# Patient Record
Sex: Female | Born: 1935 | Race: White | Hispanic: No | Smoking: Never smoker
Health system: Southern US, Community
[De-identification: ages and names within clinical notes are randomized; demographics above are authoritative.]

## PROBLEM LIST (undated history)

## (undated) DIAGNOSIS — I10 Essential (primary) hypertension: Secondary | ICD-10-CM

## (undated) DIAGNOSIS — F32A Depression, unspecified: Secondary | ICD-10-CM

## (undated) DIAGNOSIS — H3530 Unspecified macular degeneration: Secondary | ICD-10-CM

---

## 1993-04-23 HISTORY — PX: BREAST EXCISIONAL BIOPSY: SUR124

## 2004-02-29 ENCOUNTER — Ambulatory Visit: Payer: Self-pay

## 2004-04-23 HISTORY — PX: BREAST CYST ASPIRATION: SHX578

## 2004-09-18 ENCOUNTER — Ambulatory Visit: Payer: Self-pay | Admitting: Internal Medicine

## 2004-09-25 ENCOUNTER — Ambulatory Visit: Payer: Self-pay | Admitting: General Surgery

## 2004-10-10 ENCOUNTER — Ambulatory Visit: Payer: Self-pay | Admitting: General Surgery

## 2004-11-17 ENCOUNTER — Ambulatory Visit: Payer: Self-pay | Admitting: General Surgery

## 2005-10-31 ENCOUNTER — Ambulatory Visit: Payer: Self-pay | Admitting: Internal Medicine

## 2006-11-04 ENCOUNTER — Ambulatory Visit: Payer: Self-pay | Admitting: Internal Medicine

## 2006-12-10 ENCOUNTER — Ambulatory Visit: Payer: Self-pay | Admitting: Gastroenterology

## 2007-11-05 ENCOUNTER — Ambulatory Visit: Payer: Self-pay | Admitting: Internal Medicine

## 2008-11-08 ENCOUNTER — Ambulatory Visit: Payer: Self-pay | Admitting: Internal Medicine

## 2009-11-09 ENCOUNTER — Ambulatory Visit: Payer: Self-pay | Admitting: Internal Medicine

## 2011-03-06 ENCOUNTER — Ambulatory Visit: Payer: Self-pay | Admitting: Internal Medicine

## 2011-03-13 ENCOUNTER — Ambulatory Visit: Payer: Self-pay | Admitting: Internal Medicine

## 2011-12-04 ENCOUNTER — Ambulatory Visit: Payer: Self-pay | Admitting: Gastroenterology

## 2011-12-05 LAB — PATHOLOGY REPORT

## 2012-03-12 ENCOUNTER — Ambulatory Visit: Payer: Self-pay | Admitting: Internal Medicine

## 2012-09-17 ENCOUNTER — Ambulatory Visit: Payer: Self-pay | Admitting: Ophthalmology

## 2012-11-19 ENCOUNTER — Ambulatory Visit: Payer: Self-pay | Admitting: Ophthalmology

## 2013-03-24 ENCOUNTER — Ambulatory Visit: Payer: Self-pay | Admitting: Family Medicine

## 2013-07-30 IMAGING — MG MM CAD SCREENING MAMMO
1 series · 5 of 5 positions shown · non-contrast
Comparison: none

REASON FOR EXAM: routine
COMMENTS:

PROCEDURE:     MAM - MAM DGTL SCREENING MAMMO W/CAD  - March 12, 2012 [DATE]
RESULT:
Comparisons: 03/06/2011, 11/09/2009, 11/05/2007, and 11/04/2006.  Left breast
ultrasound 03/13/2011.

[R CC · right · 5 of 5 slices shown]
[im 1/5]
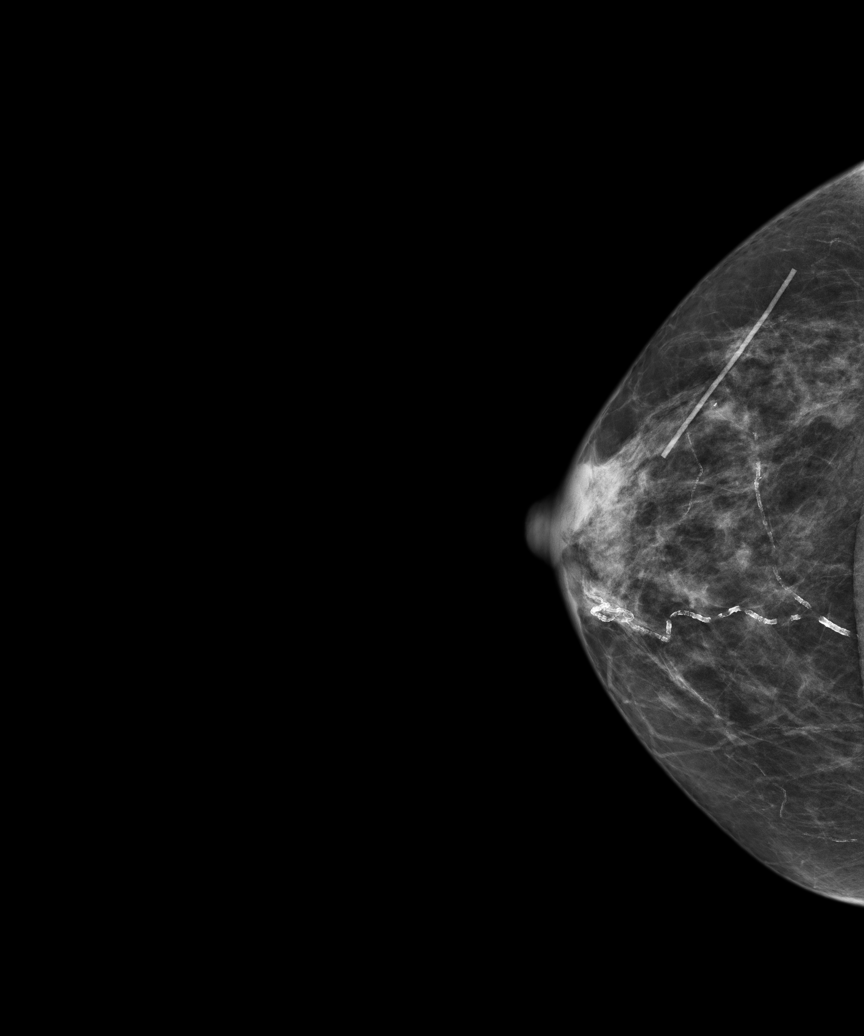
[im 2/5]
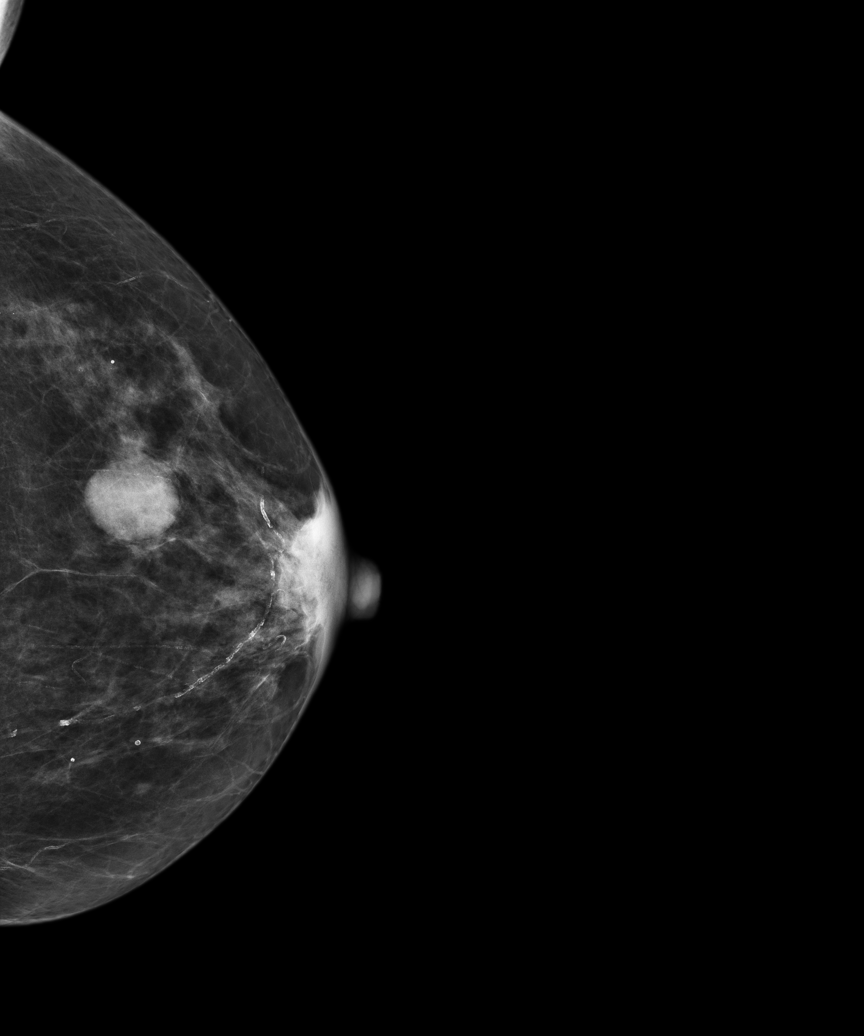
[im 3/5]
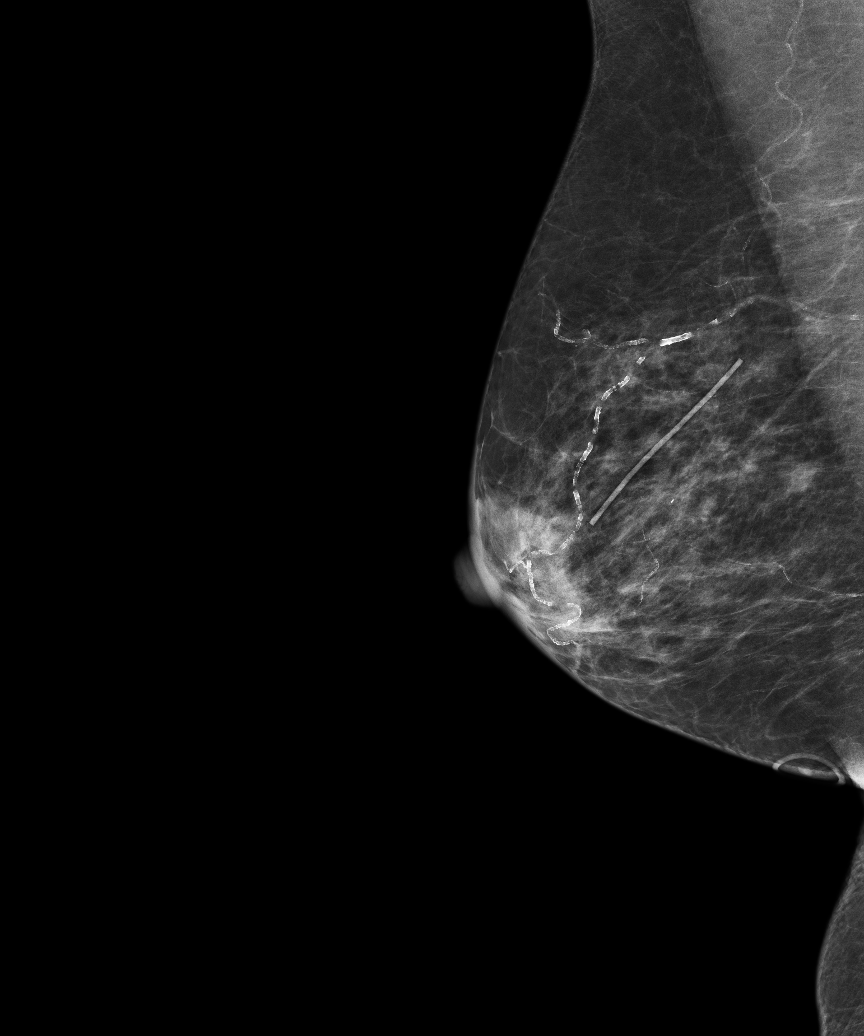
[im 4/5]
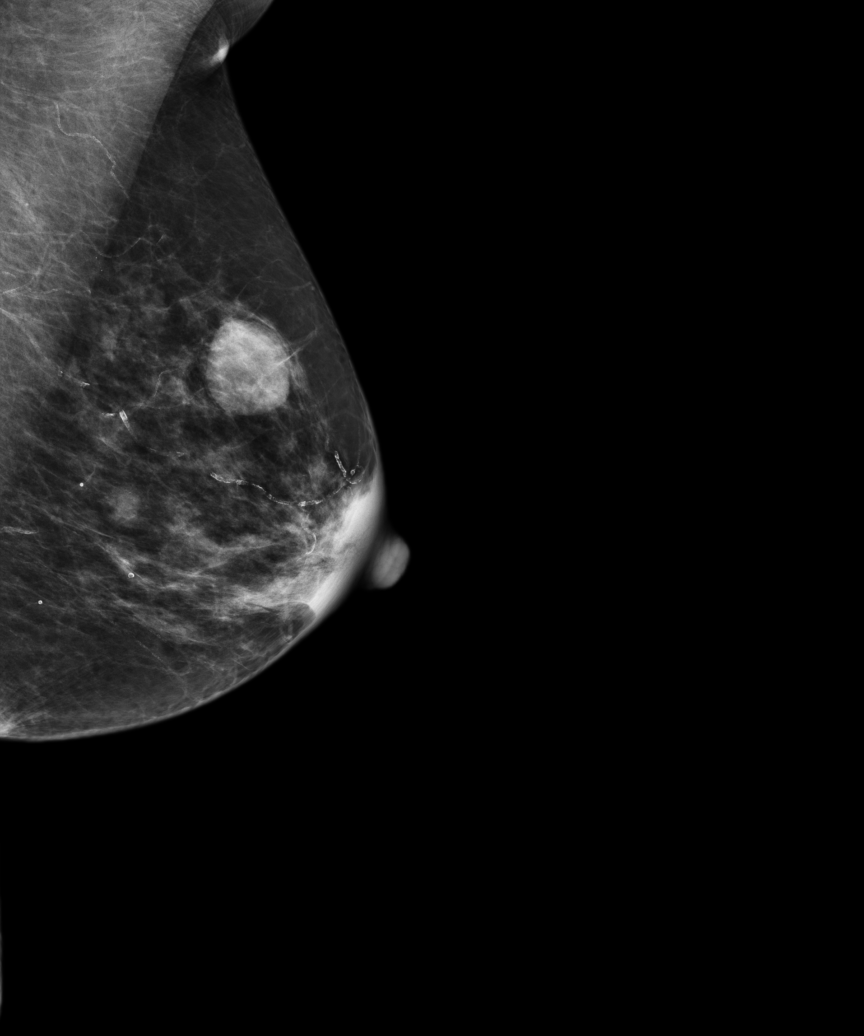
[im 5/5]
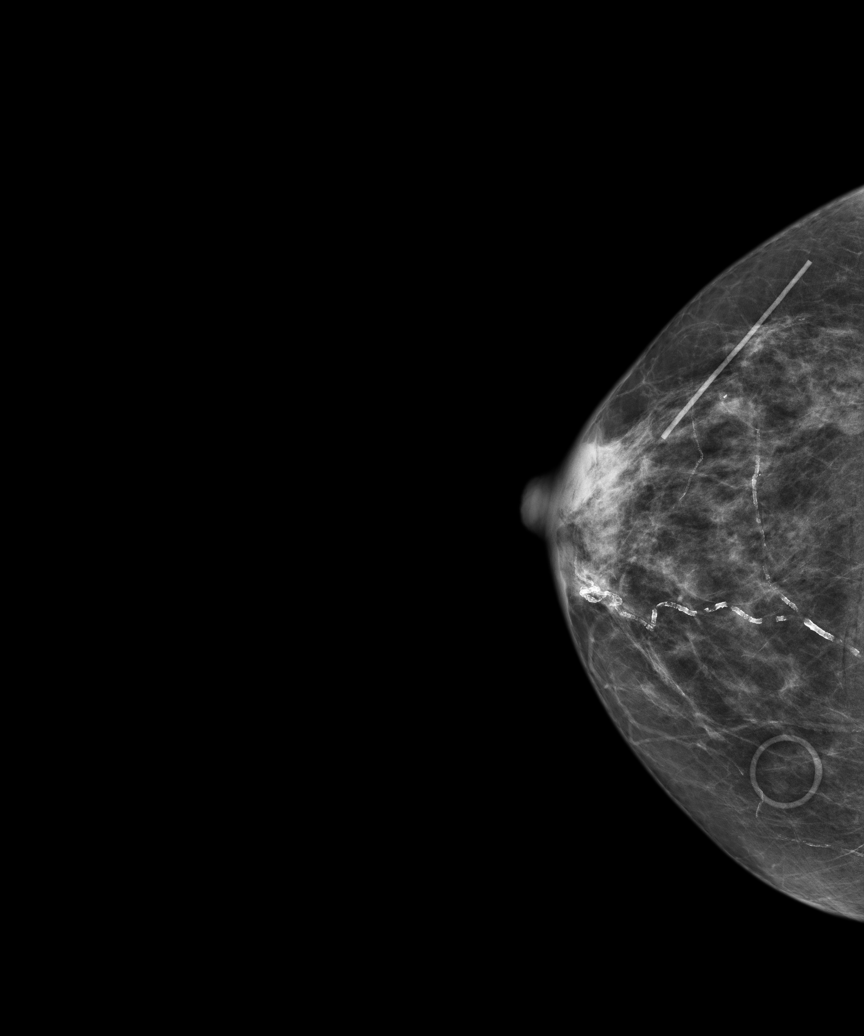

[5 of 5 positions shown; findings below may reference images not displayed]

FINDINGS: There is scattered fibroglandular tissue. The round mass in the superior
left breast is similar to minimally increased in size from prior. This was
previously shown to represent a cyst. There are other small round masses in
the bilateral breasts similar to prior studies, which likely represent
fluctuating cysts.  No suspicious masses or calcifications are identified.
IMPRESSION: BI-RADS: Category 2 - Benign Finding.

Recommend continued annual screening mammography.

A NEGATIVE MAMMOGRAM REPORT DOES NOT PRECLUDE BIOPSY OR OTHER EVALUATION OF
A CLINICALLY PALPABLE OR OTHERWISE SUSPICIOUS MASS OR LESION. BREAST CANCER
MAY NOT BE DETECTED BY MAMMOGRAPHY IN UP TO 10% OF CASES.

## 2014-05-25 ENCOUNTER — Ambulatory Visit: Payer: Self-pay | Admitting: Family Medicine

## 2015-07-18 ENCOUNTER — Other Ambulatory Visit: Payer: Self-pay | Admitting: Family Medicine

## 2015-07-18 DIAGNOSIS — Z1231 Encounter for screening mammogram for malignant neoplasm of breast: Secondary | ICD-10-CM

## 2015-08-01 ENCOUNTER — Ambulatory Visit
Admission: RE | Admit: 2015-08-01 | Discharge: 2015-08-01 | Disposition: A | Discharge status: Home / Self Care | Payer: Medicare Other | Source: Ambulatory Visit | Attending: Family Medicine | Admitting: Family Medicine

## 2015-08-01 ENCOUNTER — Other Ambulatory Visit: Payer: Self-pay | Admitting: Family Medicine

## 2015-08-01 DIAGNOSIS — Z1231 Encounter for screening mammogram for malignant neoplasm of breast: Secondary | ICD-10-CM

## 2021-03-29 ENCOUNTER — Telehealth: Payer: Self-pay | Admitting: Primary Care

## 2021-03-29 NOTE — Telephone Encounter (Signed)
Spoke with patient's granddaughter, Shawnee Gambone, regarding the Palliative referral/services and all questions were answered and she was in agreement with scheduling visit.  I have scheduled an In-home Consult for 04/10/21 @ 12:30 PM

## 2021-04-10 ENCOUNTER — Other Ambulatory Visit: Payer: Medicare HMO | Admitting: Primary Care

## 2021-04-10 ENCOUNTER — Other Ambulatory Visit: Payer: Self-pay

## 2021-04-10 ENCOUNTER — Encounter: Payer: Self-pay | Admitting: Primary Care

## 2021-04-10 VITALS — Ht 62.0 in | Wt 110.0 lb

## 2021-04-10 DIAGNOSIS — R269 Unspecified abnormalities of gait and mobility: Secondary | ICD-10-CM | POA: Insufficient documentation

## 2021-04-10 DIAGNOSIS — G2581 Restless legs syndrome: Secondary | ICD-10-CM | POA: Insufficient documentation

## 2021-04-10 DIAGNOSIS — I10 Essential (primary) hypertension: Secondary | ICD-10-CM | POA: Insufficient documentation

## 2021-04-10 DIAGNOSIS — M1990 Unspecified osteoarthritis, unspecified site: Secondary | ICD-10-CM | POA: Insufficient documentation

## 2021-04-10 DIAGNOSIS — K5710 Diverticulosis of small intestine without perforation or abscess without bleeding: Secondary | ICD-10-CM | POA: Insufficient documentation

## 2021-04-10 DIAGNOSIS — M810 Age-related osteoporosis without current pathological fracture: Secondary | ICD-10-CM | POA: Insufficient documentation

## 2021-04-10 DIAGNOSIS — Z515 Encounter for palliative care: Secondary | ICD-10-CM

## 2021-04-10 NOTE — Progress Notes (Signed)
[facility] Consult Note Telephone: [phone]  Fax: [phone]   Date of encounter: 04/10/21 3:55 PM PATIENT NAME: [name] [name] [address] [place] [address]   [phone] (home)  DOB: Mar 10, 1928 MRN: [id number] PRIMARY CARE PROVIDER:    Derinda Late, MD [address] [facility] [place],  [place] [address] [phone]   REFERRING PROVIDER:   Derinda Late, MD [phone] [facility] and [facility] [place],   [address] [phone]  RESPONSIBLE PARTY:   Extended Emergency Contact Information Primary Emergency Contact: Channell,Brandace Mobile Phone: [phone] Relation: Granddaughter   I met face to face with patient and family in  home. Palliative Care was asked to follow this patient by consultation request of  Derinda Late, MD to address advance care planning and complex medical decision making. This is the initial visit.                                     ASSESSMENT AND PLAN / RECOMMENDATIONS:   Advance Care Planning/Goals of Care: Goals include to maximize quality of life and symptom management. Patient/health care surrogate gave his/her permission to discuss.Our advance care planning conversation included a discussion about:    The value and importance of advance care planning  Experiences with loved ones who have been seriously ill or have died  Exploration of personal, cultural or spiritual beliefs that might influence medical decisions  Exploration of goals of care in the event of a sudden injury or illness  Identification of a healthcare agent - needs to change to granddaughter as her son recently died Review of an  advance directive document. Discussed MOST, want to discuss further.The rational for completing a MOST form was discussed; patient and family expressed interest in completing a MOST form. Discussed and reviewed sections  of the form in detail, opportunity for questions given and all questions answered.   Left with patient and will f/u with me or pcp. CODE STATUS: FULL  Gilda Crease is POA, with needing a back up as her son just passed away.  Have appt with lawyer for this.  Symptom Management/Plan: I assessed patient today face to face for DME Order: Rollator walker, 4 wheeled seat walker  Mobility: Has had a fall recently, able to ambulate but needs rollator for stability and rest when going long distances, + frequent falls. Able to arise (I) but has instability and ataxia with initiating gait. Current rollator was given to her and it is too responsive and large for her. Education RE risk for falling done. Also discussed life alert, which she would need for calling if in need. Given name of service at [facility].   Pain: Endorses back pain d/t fall a month ago. No imaging. It is resolving on its own. Discussed possible osteopenia and fall / fracture risks.  Goals of care: Discussed in light of recent death of her son. She witnessed CPR from EMT on her son at the home.  She will discuss with other family members.   ADLs: She is living alone now with family and friends stopping in. She is able to do alds with some supervision.   Follow up Palliative Care Visit: Palliative care will continue to follow for complex medical decision making, advance care planning, and clarification of goals. Return 8-10 weeks or prn.  I spent 75 minutes  providing this consultation. More than 50% of the time in this consultation was spent in counseling and care coordination.  PPS: 50%  HOSPICE ELIGIBILITY/DIAGNOSIS: no  Chief Complaint: mobility impairment  HISTORY OF PRESENT ILLNESS:  [name] [name] is a 87 y.o. year old female  with h/o macular degeneration, OA, weakness, abnormality of gait. She presents today to assess for community needs  in the context of frailty. Her son was caregiver but he passed away suddenly a month ago.  Granddaughter is now overseeing care. Goals of care discussed, as well as immediate needs for safety and mobility measures .   History obtained from review of EMR, discussion with primary team, and interview with family, facility staff/caregiver and/or [name].  I reviewed available labs, medications, imaging, studies and related documents from the EMR.  Records reviewed and summarized above.   ROS  General: NAD EYES: denies vision changes, uses glasses  ENMT: denies dysphagia Cardiovascular: denies chest pain, denies DOE Pulmonary: denies cough, denies increased SOB Abdomen: endorses good appetite, denies constipation, endorses continence of bowel GU: denies dysuria, endorses continence of urine MSK:  endorses weakness,  + falls reported Skin: endorses L large toe nail removal, walking boot in place Neurological: endorses moderate back  pain,  fell about a month ago, endorses insomnia, denies dizziness Psych: Endorses positive mood Heme/lymph/immuno: denies bruises, abnormal bleeding  Physical Exam: Current and past weights: 110 lbs, stable  Body mass index is 20.12 kg/m. Constitutional:162/85  HR 73 RR 18 General: frail appearing, thin EYES: anicteric sclera, lids intact, no discharge  ENMT: intact hearing, oral mucous membranes moist, dentures  CV: S1S2, RRR, no LE edema Pulmonary: LCTA, no increased work of breathing, no cough, room air Abdomen: intake 100%, normo-active BS + 4 quadrants, soft and non tender, no ascites GU: deferred MSK: + sarcopenia, moves all extremities, ambulatory with walker Skin: warm and dry, no rashes or wounds on visible skin Neuro:  + generalized weakness,  min.  cognitive impairment Psych: non-anxious affect, A and O x 3 Hem/lymph/immuno: no widespread bruising  CURRENT PROBLEM LIST:  Patient Active Problem List   Diagnosis Date Noted   Benign essential hypertension 04/10/2021   Diverticulosis of small intestine 04/10/2021   Osteoporosis  04/10/2021   Restless legs syndrome (RLS) 04/10/2021   PAST MEDICAL HISTORY:  Active Ambulatory Problems    Diagnosis Date Noted   Benign essential hypertension 04/10/2021   Diverticulosis of small intestine 04/10/2021   Osteoporosis 04/10/2021   Restless legs syndrome (RLS) 04/10/2021   Resolved Ambulatory Problems    Diagnosis Date Noted   No Resolved Ambulatory Problems   No Additional Past Medical History   SOCIAL HX:  Social History   Tobacco Use   Smoking status: Not on file   Smokeless tobacco: Not on file  Substance Use Topics   Alcohol use: Not on file   FAMILY HX:  Family History  Problem Relation Age of Onset   Cancer Mother    CVA Father    Liver disease Sister       ALLERGIES: No Known Allergies   PERTINENT MEDICATIONS:  Outpatient Encounter Medications as of 04/10/2021  Medication Sig   bisoprolol (ZEBETA) 5 MG tablet Take 1 tablet by mouth daily.   hydroxypropyl methylcellulose / hypromellose (ISOPTO TEARS / GONIOVISC) 2.5 % ophthalmic solution 1 drop as needed for dry eyes.   Melatonin 1 MG CHEW Chew 2 mg by mouth at bedtime.   Acetaminophen 500 MG capsule Take 1 tablet by  mouth every 4 (four) hours as needed.   Multiple Vitamins-Minerals (PRESERVISION AREDS 2 PO) Take 1 tablet by mouth 2 (two) times daily.   No facility-administered encounter medications on file as of 04/10/2021.    Thank you for the opportunity to participate in the care of Ms. Williamson.  The palliative care team will continue to follow. Please call our office at [phone] if we can be of additional assistance.   Jason Coop, NP , DNP, AGPCNP-BC  COVID-19 PATIENT SCREENING TOOL Asked and negative response unless otherwise noted:  Have you had symptoms of covid, tested positive or been in contact with someone with symptoms/positive test in the past 5-10 days?

## 2021-06-15 ENCOUNTER — Other Ambulatory Visit: Payer: Self-pay

## 2021-06-15 ENCOUNTER — Other Ambulatory Visit: Payer: Medicare HMO | Admitting: Primary Care

## 2021-06-15 DIAGNOSIS — R269 Unspecified abnormalities of gait and mobility: Secondary | ICD-10-CM

## 2021-06-15 DIAGNOSIS — M1990 Unspecified osteoarthritis, unspecified site: Secondary | ICD-10-CM

## 2021-06-15 DIAGNOSIS — Z515 Encounter for palliative care: Secondary | ICD-10-CM

## 2021-06-15 NOTE — Progress Notes (Signed)
[facility] Consult Note Telephone: [phone]  Fax: [phone]    Date of encounter: 06/15/21 2:40 PM PATIENT NAME: [name] [name] [address] [place] [address]   [phone] (home)  DOB: [birth year] MRN: [id number] PRIMARY CARE PROVIDER:    Derinda Late, MD,  [address] and Internal Medicine [place] [address] [phone]  REFERRING PROVIDER:   Derinda Late, MD,  [address] [facility] and Internal Medicine [place] [address] [phone]  RESPONSIBLE PARTY:    Contact Information     Name Relation Home Work Mobile   Abdulaziz,Brandace Granddaughter   [phone]        I met face to face with patient and family in  home. Palliative Care was asked to follow this patient by consultation request of  Derinda Late, MD  to address advance care planning and complex medical decision making. This is a follow up visit.                                   ASSESSMENT AND PLAN / RECOMMENDATIONS:   Advance Care Planning/Goals of Care: Goals include to maximize quality of life and symptom management. Patient/health care surrogate gave his/her permission to discuss.Our advance care planning conversation included a discussion about:    The value and importance of advance care planning  Experiences with loved ones who have been seriously ill or have died  Exploration of personal, cultural or spiritual beliefs that might influence medical decisions  Exploration of goals of care in the event of a sudden injury or illness  Identification of a healthcare agent - Grand daughter Cyndy Freeze. creation of an  advance directive document . We spoke about how she'd witnessed CPR when her son died at home recently. She did verify she understood the process and elected to have Resuscitation. CODE STATUS:  Attempt CPR  I completed a MOST form today. The patient and family outlined  their wishes for the following treatment decisions:  Cardiopulmonary Resuscitation: Attempt Resuscitation (CPR)  Medical Interventions: Limited Additional Interventions: Use medical treatment, IV fluids and cardiac monitoring as indicated, DO NOT USE intubation or mechanical ventilation. May consider use of less invasive airway support such as BiPAP or CPAP. Also provide comfort measures. Transfer to the hospital if indicated. Avoid intensive care.   Antibiotics: Antibiotics if indicated  IV Fluids: IV fluids if indicated  Feeding Tube: No feeding tube     I spent 20 minutes providing this consultation. More than 50% of the time in this consultation was spent in counseling and care coordination.  ----------------------------------------------------------------------------------------------------------------------------------------------------------------------------------------------------------------- Symptom Management/Plan:  Sleep: Working on improving sleep with PCP. Melatonin ineffective and was started on mirtazapine. Granddaughter started administering 1.5 tablets every other day to avoid drowsiness. Advised to change this regiment to taking 1/2 tablet (3.60m) nightly instead of 1.5 tablets (11.277m every other night.   Mobility: Ambulatory with walker. Church and family rotate staying with her. Wears a fall necklace with fall monitor in main living area. Has not had recent falls but sometimes walks without her walker.  Advanced Care Planning: Reviewed Medical Orders of Scope of Treatment and signed.  See above for choices. She has involved her POA in these decisions.  Back Pain: Pain has improved since last visit with the use of daily or bid Tylenol 650  mg and lidocaine patches. Could  consider tylenol cr 650 mg q 8 hrs for atc coverage.  Urinary Incontinence: Endorses 2 episodes or urinary incontinence in the last week. Denies dysuria, confusion, fever, chills, hematuria. Does not  appear to have UTI.  Discussed scheduled toileting to avoid urge incontinence.   Follow up Palliative Care Visit: Palliative care will continue to follow for complex medical decision making, advance care planning, and clarification of goals. Return 8 weeks or prn.  This visit was coded based on medical decision making (MDM).  PPS: 50%  HOSPICE ELIGIBILITY/DIAGNOSIS: TBD  Chief Complaint: Palliative care follow up  HISTORY OF PRESENT ILLNESS:  [name] [name] is a 88 y.o. year old female  with h/o macular degeneration, OA, weakness, abnormality of gait. She presents today to assess for community needs  in the context of frailty. Her friend Jeani Hawking and granddaughter Theadora Rama were present. The church community and family members are alternating spending time with her during the day, assisting with meals, and medications. Her granddaughter is her primary caregiver since the passing of her son. We reviewed advanced care planning, mobility, back pain, and new episodes of incontinence.  Overall she is doing very well and was in excellent spirits despite all the recent changes. We will continue to work on improving sleep, avoiding falls, and maintaining current health status and quality of life.   History obtained from review of EMR, discussion with primary team, and interview with family, facility staff/caregiver and/or [name].  I reviewed available labs, medications, imaging, studies and related documents from the EMR.  Records reviewed and summarized above.   ROS  General: NAD EYES: denies vision changes, has glasses  ENMT: denies dysphagia Cardiovascular: denies chest pain, denies DOE Pulmonary: denies cough, denies increased SOB Abdomen: endorses good appetite, denies constipation, endorses continence of bowel GU: denies dysuria. Endorses 2 episodes or urinary incontinence. MSK:  denies  increased weakness,  no falls reported Skin: denies rashes or wounds Neurological: trouble  sleeping Psych: Endorses positive mood Heme/lymph/immuno: denies bruises, abnormal bleeding  Physical Exam: Current and past weights: 110 reported from 12/22. Constitutional: NAD General: frail, thin EYES: anicteric sclera, lids intact, no discharge  ENMT: intact hearing, oral mucous membranes moist, dentition intact CV: S1S2, RRR, no LE edema Pulmonary: LCTA, no increased work of breathing, no cough, room air Abdomen: intake 100%,  no ascites GU: deferred MSK: moves all extremities, ambulatory with walker. Skin: warm and dry, no rashes or wounds on visible skin Neuro: + generalized weakness,  no cognitive impairment Psych: non-anxious affect, A and O x 3 Hem/lymph/immuno: no widespread bruising   Thank you for the opportunity to participate in the care of Ms. Radovich.  The palliative care team will continue to follow. Please call our office at [phone] if we can be of additional assistance.   Jason Coop, NP DNP, AGPCNP-BC  COVID-19 PATIENT SCREENING TOOL Asked and negative response unless otherwise noted:   Have you had symptoms of covid, tested positive or been in contact with someone with symptoms/positive test in the past 5-10 days?

## 2021-08-07 ENCOUNTER — Other Ambulatory Visit: Payer: Medicare HMO | Admitting: Primary Care

## 2021-08-07 DIAGNOSIS — Z515 Encounter for palliative care: Secondary | ICD-10-CM

## 2021-08-07 DIAGNOSIS — M1990 Unspecified osteoarthritis, unspecified site: Secondary | ICD-10-CM

## 2021-08-07 DIAGNOSIS — R269 Unspecified abnormalities of gait and mobility: Secondary | ICD-10-CM

## 2021-08-07 NOTE — Progress Notes (Signed)
? ? ?[facility] ?[facility] Consult Note ?Telephone: [phone]  ?Fax: [phone]  ? ? ?Date of encounter: 08/07/21 ?4:49 PM ?PATIENT NAME: [name] [name] ?[address] ?[place] [address]   ?[phone] (home)  ?DOB: 06/03/1927 ?MRN: [id number] ?PRIMARY CARE PROVIDER:    ?Derinda Late, MD,  ?[address] [place] and Internal Medicine ?[place] [address] ?[phone] ? ?REFERRING PROVIDER:   ?Derinda Late, MD ?[address] and Internal Medicine ?[place],  [place] [address] ?[phone] ? ?RESPONSIBLE PARTY:    ?Contact Information   ? ? Name Relation Home Work Mobile  ? Belkin,Brandace Granddaughter   [phone]  ? ?  ? ? ?I met face to face with patient and family in  home. Palliative Care was asked to follow this patient by consultation request of  Derinda Late, MD to address advance care planning and complex medical decision making. This is a follow up visit. ? ?                                 ASSESSMENT AND PLAN / RECOMMENDATIONS:  ? ?Advance Care Planning/Goals of Care: Goals include to maximize quality of life and symptom management. Patient/health care surrogate gave his/her permission to discuss.Our advance care planning conversation included a discussion about:    ? ?Exploration of personal, cultural or spiritual beliefs that might influence medical decisions  ?Exploration of goals of care in the event of a sudden injury or illness  ?CODE STATUS: Full code. Wants limited intervention. Discussed after seeing her son have CPR and passing away. ? ?Symptom Management/Plan: ? ?Sleep:  Endorses some day time sleeping and poor pm sleep. Also has RLS, see below. Endorses being a busy person, and we discussed her keeping awake in the day and doing more activities. ? ?Mood: Appears to be better. Eating well. Still on mirtazapine which I would suggest to continue. ? ?Nutrition: Eating well and appears  WNWd. ? ?Restless leg: will try vitamins, B12 and folates and iron. They may also consider homeopathy.  This keeps her awake at night. ? ?Follow up Palliative Care Visit: Palliative care will continue to follow for complex medical decision making, advance care planning, and clarification of goals. Return 16 weeks or prn. ? ?This visit was coded based on medical decision making (MDM). ? ?PPS: 50% ? ?HOSPICE ELIGIBILITY/DIAGNOSIS: TBD ? ?Chief Complaint: frailty, immobility ? ?HISTORY OF PRESENT ILLNESS:  [name] [name] is a 88 y.o. year old female  with depression, abnormality of gait, debility. . Patient seen today to review palliative care needs to include medical decision making and advance care planning as appropriate.  ? ?History obtained from review of EMR, discussion with primary team, and interview with family, facility staff/caregiver and/or [name].  ?I reviewed available labs, medications, imaging, studies and related documents from the EMR.  Records reviewed and summarized above.  ? ?ROS ? ? ?General: NAD ?EYES: denies vision changes, has glasses  ?ENMT: denies dysphagia ?Pulmonary: denies cough, denies increased SOB ?Abdomen: endorses good appetite, denies constipation, endorses continence of bowel ?GU: denies dysuria, endorses continence of urine ?MSK:  denies  increased weakness,  no falls reported ?Skin: denies rashes or wounds ?Neurological: denies pain, endorses occ insomnia, endorses RLS at hs ?Psych: Endorses positive mood ? ?Physical Exam: ?Current and past weights: stable  ?Constitutional: NAD ?General: frail appearing, thin ?EYES: anicteric sclera,  lids intact, no discharge  ?ENMT: intact hearing, oral mucous membranes moist, dentition intact ?CV:  no LE edema ?Pulmonary:  no increased work of breathing, no cough, room air ?Abdomen: intake 80%,  no ascites ?MSK: + sarcopenia, moves all extremities, ambulatory with walker  ?Skin: warm and dry, no rashes or wounds on visible skin ?Neuro:  no   new generalized weakness,  no cognitive impairment, non-anxious affect ? ? ?Thank you for the opportunity to participate in the care of [name] [name].  The palliative care team will continue to follow. Please call our office at [phone] if we can be of additional assistance.  ? ?Jason Coop, NP DNP, AGPCNP-BC ? ?COVID-19 PATIENT SCREENING TOOL ?Asked and negative response unless otherwise noted:  ? ?Have you had symptoms of covid, tested positive or been in contact with someone with symptoms/positive test in the past 5-10 days?  ? ?

## 2021-08-10 ENCOUNTER — Other Ambulatory Visit: Payer: Medicare HMO | Admitting: Primary Care

## 2021-12-08 ENCOUNTER — Other Ambulatory Visit: Payer: Medicare HMO | Admitting: Primary Care

## 2021-12-12 ENCOUNTER — Other Ambulatory Visit: Payer: Medicare HMO | Admitting: Primary Care

## 2021-12-12 DIAGNOSIS — M1990 Unspecified osteoarthritis, unspecified site: Secondary | ICD-10-CM

## 2021-12-12 DIAGNOSIS — R269 Unspecified abnormalities of gait and mobility: Secondary | ICD-10-CM

## 2021-12-12 DIAGNOSIS — Z515 Encounter for palliative care: Secondary | ICD-10-CM

## 2021-12-12 NOTE — Progress Notes (Signed)
[facility] Consult Note Telephone: [phone]  Fax: [phone]    Date of encounter: 12/12/21 4:00 PM PATIENT NAME: [name] [name] [place] [address]   [phone] (home)  DOB: 1927-08-10 MRN: [id number] PRIMARY CARE PROVIDER:    Derinda Late, MD,  [address] and [facility] [place] [address] [phone]  REFERRING PROVIDER:   Derinda Late, MD [phone] [address] [employer] [facility] and [facility] [place],  [place] [address] [phone]  RESPONSIBLE PARTY:    Contact Information     Name Relation Home Work Mobile   Kutner,Brandace Granddaughter   [phone]        I met face to face with patient and family in  home. Palliative Care was asked to follow this patient by consultation request of  Derinda Late, MD to address advance care planning and complex medical decision making. This is a follow up visit.                                   ASSESSMENT AND PLAN / RECOMMENDATIONS:   Advance Care Planning/Goals of Care: Goals include to maximize quality of life and symptom management. Patient/health care surrogate gave his/her permission to discuss.Our advance care planning conversation included a discussion about:    The value and importance of advance care planning  Experiences with loved ones who have been seriously ill or have died  Exploration of personal, cultural or spiritual beliefs that might influence medical decisions  Exploration of goals of care in the event of a sudden injury or illness  Identification of a healthcare agent - Brandace Review and updating of an  advance directive document . Decision not to resuscitate due to poor prognosis. Wants to be more conservative with heroic measures and has done a new MOST form. CODE STATUS: DNR  I completed a MOST form today. The patient and family outlined their wishes for the following  treatment decisions:  Cardiopulmonary Resuscitation: Do Not Attempt Resuscitation (DNR/No CPR)  Medical Interventions: Limited Additional Interventions: Use medical treatment, IV fluids and cardiac monitoring as indicated, DO NOT USE intubation or mechanical ventilation. May consider use of less invasive airway support such as BiPAP or CPAP. Also provide comfort measures. Transfer to the hospital if indicated. Avoid intensive care.   Antibiotics: Antibiotics if indicated  IV Fluids: IV fluids if indicated  Feeding Tube: No feeding tube    I spent 20 minutes providing this consultation. More than 50% of the time in this consultation was spent in counseling and care coordination. ---------------------------------------------------------------------------------------------------------------------------  Symptom Management/Plan:  Fall: Had fall in home, large bruise on chest. Discussed fall prevention. Discussed walker and sitting for activities.  Exercise program: I recommend chair exercises to address balance and strength, as well as joint pain.  Pain: OA pain and from fall.Discussed topical analgesic, and exercises.  Also taking Acetaminophen CR [phone] mg q 8 hrs.  Nutrition: Improving, she has gained 7% weight. Good appetite.  Safety: Has alert button, plus fall detector which did n't go off when she fell. We discusse GPS enabled. She did n't remember to push when she fell, we discussed that she may be incapacitated after a fall and unable to call for help. She has people checking in, spending the night. She values her independence.  Follow up Palliative Care Visit: Palliative care will continue  to follow for complex medical decision making, advance care planning, and clarification of goals. Return 3-4 months per family request, or prn.  This visit was coded based on medical decision making (MDM).  PPS: 50%  HOSPICE ELIGIBILITY/DIAGNOSIS: TBD  Chief Complaint: pain, fall, ACP,  frailty  HISTORY OF PRESENT ILLNESS:  [name] is a 88 y.o. year old female  with OA, frailty, frequent falls, debility . Patient seen today to review palliative care needs to include medical decision making and advance care planning as appropriate.   History obtained from review of EMR, discussion with primary team, and interview with family, facility staff/caregiver and/or [name].  I reviewed available labs, medications, imaging, studies and related documents from the EMR.  Records reviewed and summarized above.   ROS   General: NAD ENMT: denies dysphagia Cardiovascular: denies CV chest pain, denies DOE Pulmonary: denies cough, denies increased SOB Abdomen: endorses good appetite, denies constipation, endorses continence of bowel GU: denies dysuria, endorses continence of urine MSK:  denies  increased weakness,  ++ falls reported, possible rib/ clavicle fracture Skin: denies rashes or wounds, large chest bruise s/p fall  Neurological: endorses OA pain, pain from fall R clavicle, denies insomnia Psych: Endorses positive mood  Physical Exam: Current and past weights:110 lbs in 12/22, now 118 lbs, good intake, 7% gain. Constitutional: NAD General: frail appearing, thin EYES: anicteric sclera, lids intact, no discharge  ENMT: intact hearing, oral mucous membranes moist, dentition intact CV:  no LE edema Pulmonary:  no increased work of breathing, no cough, room air Abdomen: intake 175-00%, soft and non tender, no ascites MSK: ++ sarcopenia, moves all extremities, ambulatory with walker  Skin: warm and dry, no rashes or wounds on visible skin, bruises R chest s/p fall Neuro:  +generalized weakness,  mild  cognitive impairment, non-anxious affect   Thank you for the opportunity to participate in the care of Ms. Gilbertson.  The palliative care team will continue to follow. Please call our office at [phone] if we can be of additional assistance.   Jason Coop, NP  DNP, AGPCNP-BC  COVID-19 PATIENT SCREENING TOOL Asked and negative response unless otherwise noted:   Have you had symptoms of covid, tested positive or been in contact with someone with symptoms/positive test in the past 5-10 days?

## 2022-03-08 ENCOUNTER — Other Ambulatory Visit: Payer: Medicare HMO | Admitting: Primary Care

## 2022-03-08 DIAGNOSIS — M1990 Unspecified osteoarthritis, unspecified site: Secondary | ICD-10-CM

## 2022-03-08 DIAGNOSIS — Z515 Encounter for palliative care: Secondary | ICD-10-CM

## 2022-03-08 DIAGNOSIS — R269 Unspecified abnormalities of gait and mobility: Secondary | ICD-10-CM

## 2022-03-08 NOTE — Progress Notes (Signed)
[facility] Consult Note Telephone: [phone]  Fax: [phone]    Date of encounter: 03/08/22 2:14 PM PATIENT NAME: [name] [name] [address] [place] [address] [address] [place] [address]   [phone] (home)  DOB: [birth year] MRN: [id number] PRIMARY CARE PROVIDER:    Derinda Late, MD,  [address] and [facility] [place] [address] [phone]  REFERRING PROVIDER:   Derinda Late, MD [phone] [address] [facility] and Internal Medicine [place],  [place] [address] [phone]  RESPONSIBLE PARTY:    Contact Information     Name Relation Home Work Mobile   [name],[name] Granddaughter   [phone]       I met face to face with patient and family in  home. Palliative Care was asked to follow this patient by consultation request of  Derinda Late, MD to address advance care planning and complex medical decision making. This is a follow up visit.                                   ASSESSMENT AND PLAN / RECOMMENDATIONS:   Advance Care Planning/Goals of Care: Goals include to maximize quality of life and symptom management. Patient/health care surrogate gave his/her permission to discuss. Our advance care planning conversation included a discussion about:    The value and importance of advance care planning  Exploration of personal, cultural or spiritual beliefs that might influence medical decisions  Exploration of goals of care in the event of a sudden injury or illness  Identification of a healthcare agent - [name] Review of an  advance directive document - No changes CODE STATUS:  DNR  Symptom Management/Plan:  Safety: Had fall in late Sept.  No falls since. Some BP adjustments. Has in home alarm and fall detection.  Mobility: Uses rollator in home, likes cane but not stable enough for safe ambulation. Had home PT and is doing HEP.  BP: has had some lows, reduced BP meds.  Recommend amlodipine. PCP To follow.   Pain: Endorses OA pain in joints, R shoulder and cervical spine.  Recommend Voltaren gel bid. Recommend ATC acetaminophen CR 650 mg q 8 hrs. Denies pain from fall.  Sight: F/u with opthalmology. Has had visual changes R eye.  Follow up Palliative Care Visit: Palliative care  will end NP  home visiting program.  I spent 40 minutes providing this consultation. More than 50% of the time in this consultation was spent in counseling and care coordination.  PPS: 50%  HOSPICE ELIGIBILITY/DIAGNOSIS: TBD  Chief Complaint: debility, fall risk  HISTORY OF PRESENT ILLNESS:  [name] [name] is a 88 y.o. year old female  with HTN, debility, falls and fall risk. Marland Kitchen   History obtained from review of EMR, discussion with primary team, and interview with family, facility staff/caregiver and/or Ms. [name] [name].  I reviewed available labs, medications, imaging, studies and related documents from the EMR.  Records reviewed and summarized above.   ROS   General: NAD EYES: endorses vision changes ENMT: denies dysphagia Cardiovascular: denies chest pain, denies DOE Pulmonary: denies cough, denies increased SOB Abdomen: endorses good appetite, denies constipation, endorses continence of bowel GU: denies dysuria, endorses continence of urine MSK:  denies increased weakness,  1 fall reported Skin: denies rashes or wounds, has healed from 9/23. Neurological: denies pain, denies insomnia Psych: Endorses positive mood Heme/lymph/immuno: denies bruises, abnormal  bleeding  Physical Exam: Current and past weights: stable Constitutional: NAD General: frail appearing, thin EYES: anicteric sclera, lids intact, no discharge  ENMT: intact hearing, oral mucous membranes moist, dentition intact CV:  no LE edema Pulmonary:  no increased work of breathing, no cough, room air Abdomen: intake 70%, , soft and non tender, no ascites GU: deferred MSK: + sarcopenia, moves all  extremities, ambulatory with walker  Skin: warm and dry, no rashes or wounds on visible skin Neuro:  +generalized weakness,  no cognitive impairment Psych: non-anxious affect, A and O x 3 Hem/lymph/immuno: no widespread bruising   Thank you for the opportunity to participate in the care of [name] [name]. Please call our office at [phone] if we can be of additional assistance.  Jason Coop DNP, MPH, AGPCNP-BC, ACHPN   COVID-19 PATIENT SCREENING TOOL Asked and negative response unless otherwise noted:   Have you had symptoms of covid, tested positive or been in contact with someone with symptoms/positive test in the past 5-10 days?

## 2022-03-20 ENCOUNTER — Other Ambulatory Visit: Payer: Medicare HMO | Admitting: Primary Care

## 2022-07-01 ENCOUNTER — Encounter: Payer: Self-pay | Admitting: Internal Medicine

## 2022-07-01 ENCOUNTER — Inpatient Hospital Stay: Payer: Medicare HMO

## 2022-07-01 ENCOUNTER — Emergency Department: Payer: Medicare HMO

## 2022-07-01 ENCOUNTER — Inpatient Hospital Stay
Admission: EM | Admit: 2022-07-01 | Discharge: 2022-07-04 | DRG: 065 | Disposition: A | Discharge status: Home Health | Payer: Medicare HMO | Attending: Family Medicine | Admitting: Family Medicine

## 2022-07-01 ENCOUNTER — Other Ambulatory Visit: Payer: Self-pay

## 2022-07-01 DIAGNOSIS — W19XXXA Unspecified fall, initial encounter: Secondary | ICD-10-CM | POA: Diagnosis not present

## 2022-07-01 DIAGNOSIS — D649 Anemia, unspecified: Secondary | ICD-10-CM | POA: Diagnosis present

## 2022-07-01 DIAGNOSIS — I10 Essential (primary) hypertension: Secondary | ICD-10-CM | POA: Diagnosis present

## 2022-07-01 DIAGNOSIS — R7303 Prediabetes: Secondary | ICD-10-CM | POA: Diagnosis present

## 2022-07-01 DIAGNOSIS — G2581 Restless legs syndrome: Secondary | ICD-10-CM | POA: Diagnosis present

## 2022-07-01 DIAGNOSIS — J9811 Atelectasis: Secondary | ICD-10-CM | POA: Diagnosis present

## 2022-07-01 DIAGNOSIS — Z66 Do not resuscitate: Secondary | ICD-10-CM | POA: Diagnosis present

## 2022-07-01 DIAGNOSIS — I82C21 Chronic embolism and thrombosis of right internal jugular vein: Secondary | ICD-10-CM | POA: Diagnosis present

## 2022-07-01 DIAGNOSIS — R270 Ataxia, unspecified: Secondary | ICD-10-CM | POA: Diagnosis present

## 2022-07-01 DIAGNOSIS — H53462 Homonymous bilateral field defects, left side: Secondary | ICD-10-CM | POA: Diagnosis present

## 2022-07-01 DIAGNOSIS — W1839XA Other fall on same level, initial encounter: Secondary | ICD-10-CM | POA: Diagnosis present

## 2022-07-01 DIAGNOSIS — R4182 Altered mental status, unspecified: Secondary | ICD-10-CM | POA: Diagnosis not present

## 2022-07-01 DIAGNOSIS — I63531 Cerebral infarction due to unspecified occlusion or stenosis of right posterior cerebral artery: Principal | ICD-10-CM | POA: Diagnosis present

## 2022-07-01 DIAGNOSIS — M810 Age-related osteoporosis without current pathological fracture: Secondary | ICD-10-CM | POA: Diagnosis present

## 2022-07-01 DIAGNOSIS — F329 Major depressive disorder, single episode, unspecified: Secondary | ICD-10-CM | POA: Diagnosis present

## 2022-07-01 DIAGNOSIS — Z86718 Personal history of other venous thrombosis and embolism: Secondary | ICD-10-CM

## 2022-07-01 DIAGNOSIS — G4730 Sleep apnea, unspecified: Secondary | ICD-10-CM | POA: Diagnosis present

## 2022-07-01 DIAGNOSIS — Z79899 Other long term (current) drug therapy: Secondary | ICD-10-CM

## 2022-07-01 DIAGNOSIS — R200 Anesthesia of skin: Secondary | ICD-10-CM | POA: Diagnosis present

## 2022-07-01 DIAGNOSIS — Z1152 Encounter for screening for COVID-19: Secondary | ICD-10-CM | POA: Diagnosis not present

## 2022-07-01 DIAGNOSIS — E041 Nontoxic single thyroid nodule: Secondary | ICD-10-CM | POA: Diagnosis present

## 2022-07-01 DIAGNOSIS — I639 Cerebral infarction, unspecified: Secondary | ICD-10-CM | POA: Diagnosis present

## 2022-07-01 DIAGNOSIS — H353290 Exudative age-related macular degeneration, unspecified eye, stage unspecified: Secondary | ICD-10-CM | POA: Diagnosis present

## 2022-07-01 DIAGNOSIS — Z823 Family history of stroke: Secondary | ICD-10-CM

## 2022-07-01 DIAGNOSIS — R29702 NIHSS score 2: Secondary | ICD-10-CM | POA: Diagnosis not present

## 2022-07-01 DIAGNOSIS — I82C29 Chronic embolism and thrombosis of unspecified internal jugular vein: Secondary | ICD-10-CM | POA: Diagnosis not present

## 2022-07-01 DIAGNOSIS — R42 Dizziness and giddiness: Secondary | ICD-10-CM | POA: Diagnosis present

## 2022-07-01 DIAGNOSIS — G8314 Monoplegia of lower limb affecting left nondominant side: Secondary | ICD-10-CM | POA: Diagnosis present

## 2022-07-01 DIAGNOSIS — Y92009 Unspecified place in unspecified non-institutional (private) residence as the place of occurrence of the external cause: Secondary | ICD-10-CM

## 2022-07-01 DIAGNOSIS — I6389 Other cerebral infarction: Secondary | ICD-10-CM | POA: Diagnosis not present

## 2022-07-01 DIAGNOSIS — I679 Cerebrovascular disease, unspecified: Secondary | ICD-10-CM | POA: Diagnosis not present

## 2022-07-01 DIAGNOSIS — R29701 NIHSS score 1: Secondary | ICD-10-CM | POA: Diagnosis present

## 2022-07-01 DIAGNOSIS — F32A Depression, unspecified: Secondary | ICD-10-CM | POA: Diagnosis present

## 2022-07-01 HISTORY — DX: Depression, unspecified: F32.A

## 2022-07-01 HISTORY — DX: Essential (primary) hypertension: I10

## 2022-07-01 HISTORY — DX: Unspecified macular degeneration: H35.30

## 2022-07-01 LAB — COMPREHENSIVE METABOLIC PANEL
ALT: 14 U/L (ref 0–44)
AST: 24 U/L (ref 15–41)
Albumin: 4.1 g/dL (ref 3.5–5.0)
Alkaline Phosphatase: 71 U/L (ref 38–126)
Anion gap: 11 (ref 5–15)
BUN: 13 mg/dL (ref 8–23)
CO2: 24 mmol/L (ref 22–32)
Calcium: 9.2 mg/dL (ref 8.9–10.3)
Chloride: 105 mmol/L (ref 98–111)
Creatinine, Ser: 0.74 mg/dL (ref 0.44–1.00)
GFR, Estimated: >60 mL/min (ref >60)
Glucose, Bld: 100 mg/dL — ABNORMAL HIGH (ref 70–99)
Potassium: 3.7 mmol/L (ref 3.5–5.1)
Sodium: 140 mmol/L (ref 135–145)
Total Bilirubin: 0.7 mg/dL (ref 0.3–1.2)
Total Protein: 6.6 g/dL (ref 6.5–8.1)

## 2022-07-01 LAB — CBC WITH DIFFERENTIAL/PLATELET
Abs Immature Granulocytes: 0.02 10*3/uL (ref 0.00–0.07)
Basophils Absolute: 0 10*3/uL (ref 0.0–0.1)
Basophils Relative: 0 %
Eosinophils Absolute: 0 10*3/uL (ref 0.0–0.5)
Eosinophils Relative: 1 %
HCT: 41.5 % (ref 36.0–46.0)
Hemoglobin: 13.5 g/dL (ref 12.0–15.0)
Immature Granulocytes: 1 %
Lymphocytes Relative: 20 %
Lymphs Abs: 0.8 10*3/uL (ref 0.7–4.0)
MCH: 30.3 pg (ref 26.0–34.0)
MCHC: 32.5 g/dL (ref 30.0–36.0)
MCV: 93 fL (ref 80.0–100.0)
Monocytes Absolute: 0.8 10*3/uL (ref 0.1–1.0)
Monocytes Relative: 18 %
Neutro Abs: 2.5 10*3/uL (ref 1.7–7.7)
Neutrophils Relative %: 60 %
Platelets: 139 10*3/uL — ABNORMAL LOW (ref 150–400)
RBC: 4.46 MIL/uL (ref 3.87–5.11)
RDW: 12.8 % (ref 11.5–15.5)
WBC: 4.2 10*3/uL (ref 4.0–10.5)
nRBC: 0 % (ref 0.0–0.2)

## 2022-07-01 LAB — RESP PANEL BY RT-PCR (RSV, FLU A&B, COVID)  RVPGX2
Influenza A by PCR: NEGATIVE
Influenza B by PCR: NEGATIVE
Resp Syncytial Virus by PCR: NEGATIVE
SARS Coronavirus 2 by RT PCR: NEGATIVE

## 2022-07-01 LAB — GLUCOSE, CAPILLARY: Glucose-Capillary: 156 mg/dL — ABNORMAL HIGH (ref 70–99)

## 2022-07-01 LAB — T4, FREE: Free T4: 0.99 ng/dL (ref 0.61–1.12)

## 2022-07-01 LAB — LACTIC ACID, PLASMA
Lactic Acid, Venous: 1.5 mmol/L (ref 0.5–1.9)
Lactic Acid, Venous: 1.4 mmol/L (ref 0.5–1.9)

## 2022-07-01 LAB — TROPONIN I (HIGH SENSITIVITY)
Troponin I (High Sensitivity): 9 ng/L (ref <18)
Troponin I (High Sensitivity): 10 ng/L (ref <18)

## 2022-07-01 LAB — MAGNESIUM: Magnesium: 2.2 mg/dL (ref 1.7–2.4)

## 2022-07-01 LAB — TSH: TSH: 1.619 u[IU]/mL (ref 0.350–4.500)

## 2022-07-01 MED ORDER — HYDRALAZINE HCL 20 MG/ML IJ SOLN: 5.0000 mg | INTRAMUSCULAR | Status: DC | PRN

## 2022-07-01 MED ORDER — BISOPROLOL FUMARATE 5 MG PO TABS
5.0000 mg | ORAL_TABLET | Freq: Every day | ORAL | Status: DC
  Administered 2022-07-01 – 2022-07-04 (×4): 5 mg via ORAL
  Filled 2022-07-01 (×4): qty 1

## 2022-07-01 MED ORDER — IOHEXOL 350 MG/ML SOLN
75.0000 mL | Freq: Once | INTRAVENOUS | Status: AC | PRN
  Administered 2022-07-01: 75 mL via INTRAVENOUS

## 2022-07-01 MED ORDER — ACETAMINOPHEN 650 MG RE SUPP: 650.0000 mg | RECTAL | Status: DC | PRN

## 2022-07-01 MED ORDER — ASPIRIN 325 MG PO TABS: 325.0000 mg | ORAL_TABLET | Freq: Every day | ORAL | Status: DC

## 2022-07-01 MED ORDER — SENNOSIDES-DOCUSATE SODIUM 8.6-50 MG PO TABS: 1.0000 | ORAL_TABLET | Freq: Every evening | ORAL | Status: DC | PRN

## 2022-07-01 MED ORDER — ASPIRIN 300 MG RE SUPP: 300.0000 mg | Freq: Every day | RECTAL | Status: DC

## 2022-07-01 MED ORDER — ACETAMINOPHEN 325 MG PO TABS
650.0000 mg | ORAL_TABLET | ORAL | Status: DC | PRN
  Administered 2022-07-03: 650 mg via ORAL
  Filled 2022-07-01: qty 2

## 2022-07-01 MED ORDER — CLOPIDOGREL BISULFATE 75 MG PO TABS
75.0000 mg | ORAL_TABLET | Freq: Every day | ORAL | Status: DC
  Administered 2022-07-02 – 2022-07-04 (×3): 75 mg via ORAL
  Filled 2022-07-01 (×3): qty 1

## 2022-07-01 MED ORDER — ONDANSETRON HCL 4 MG/2ML IJ SOLN: 4.0000 mg | Freq: Three times a day (TID) | INTRAMUSCULAR | Status: DC | PRN

## 2022-07-01 MED ORDER — CLOPIDOGREL BISULFATE 75 MG PO TABS
300.0000 mg | ORAL_TABLET | Freq: Once | ORAL | Status: AC
  Administered 2022-07-01: 300 mg via ORAL
  Filled 2022-07-01: qty 4

## 2022-07-01 MED ORDER — ATORVASTATIN CALCIUM 20 MG PO TABS
40.0000 mg | ORAL_TABLET | Freq: Every day | ORAL | Status: DC
  Administered 2022-07-01 – 2022-07-02 (×2): 40 mg via ORAL
  Filled 2022-07-01 (×2): qty 2

## 2022-07-01 MED ORDER — ACETAMINOPHEN 160 MG/5ML PO SOLN: 650.0000 mg | ORAL | Status: DC | PRN

## 2022-07-01 MED ORDER — ASPIRIN 300 MG RE SUPP
300.0000 mg | Freq: Once | RECTAL | Status: AC
  Filled 2022-07-01: qty 1

## 2022-07-01 MED ORDER — ASPIRIN 81 MG PO TBEC
81.0000 mg | DELAYED_RELEASE_TABLET | Freq: Every day | ORAL | Status: DC
  Administered 2022-07-03 – 2022-07-04 (×2): 81 mg via ORAL
  Filled 2022-07-01 (×2): qty 1

## 2022-07-01 MED ORDER — STROKE: EARLY STAGES OF RECOVERY BOOK: Freq: Once | Status: AC

## 2022-07-01 MED ORDER — MIRTAZAPINE 15 MG PO TABS
7.5000 mg | ORAL_TABLET | Freq: Every day | ORAL | Status: DC
  Administered 2022-07-01 – 2022-07-03 (×3): 7.5 mg via ORAL
  Filled 2022-07-01 (×3): qty 1

## 2022-07-01 MED ORDER — ATORVASTATIN CALCIUM 20 MG PO TABS: 40.0000 mg | ORAL_TABLET | Freq: Every day | ORAL | Status: DC

## 2022-07-01 MED ORDER — ASPIRIN 325 MG PO TABS
325.0000 mg | ORAL_TABLET | Freq: Once | ORAL | Status: AC
  Administered 2022-07-01: 325 mg via ORAL
  Filled 2022-07-01: qty 1

## 2022-07-01 MED ORDER — ADULT MULTIVITAMIN W/MINERALS CH
1.0000 | ORAL_TABLET | Freq: Every day | ORAL | Status: DC
  Administered 2022-07-02 – 2022-07-04 (×3): 1 via ORAL
  Filled 2022-07-01 (×3): qty 1

## 2022-07-01 MED ORDER — ENOXAPARIN SODIUM 30 MG/0.3ML IJ SOSY
30.0000 mg | PREFILLED_SYRINGE | INTRAMUSCULAR | Status: DC
  Administered 2022-07-01: 30 mg via SUBCUTANEOUS
  Filled 2022-07-01: qty 0.3

## 2022-07-01 MED ORDER — LORAZEPAM 2 MG/ML IJ SOLN
1.0000 mg | Freq: Once | INTRAMUSCULAR | Status: AC
  Administered 2022-07-01: 1 mg via INTRAVENOUS
  Filled 2022-07-01: qty 1

## 2022-07-01 NOTE — ED Triage Notes (Addendum)
Pt arrived via EMS for weakness. Per EMS, family states she has been more weak recently with cognitive issues. Pt had a fall yesterday, normally walks with walker. Per Pt she felt dizzy and her arms/legs were numb. Pt is A&Ox4. Pt denies any pain. Pt has not taken any bp meds in the last few days.

## 2022-07-01 NOTE — ED Provider Notes (Signed)
[facility] Provider Note    Event Date/Time   First MD Initiated Contact with Patient 07/01/22 1109     (approximate)   History   Weakness   HPI  [name] [name] is a 89 y.o. female   Past medical history of hypertension, prediabetes, chronic anemia, thrombocytopenia who presents to the emergency department with lightheadedness upon standing for the last 3 days, generalized weakness, more confusion from baseline per family, and has had a fall due to her dizziness sustained yesterday.  Normally walks with a walker.  She had her medications adjusted by her primary doctor and had her antihypertensive beta-blocker halved due to normalized blood pressures and she states that over the last 2 weeks she has not taken his blood pressure medication at all.  Her fall yesterday was in the setting of lightheadedness and she sustained no head strike or loss of consciousness and lowered herself to the ground slowly with no significant trauma sustained.  She denies any focal infectious symptoms or pain specifically denying respiratory infectious symptoms, GI or GU complaints, nausea vomiting or diarrhea and has no chest pain or shortness of breath.  She does feel some tingling to bilateral hands which have coincided with her lightheadedness over the last several days.   External Medical Documents Reviewed: Office visit from her outpatient clinic in January 2024 detailing her blood pressure med management      Physical Exam   Triage Vital Signs: ED Triage Vitals  Enc Vitals Group     BP 07/01/22 1114 (!) 206/98     Pulse Rate 07/01/22 1114 81     Resp 07/01/22 1114 18     Temp 07/01/22 1114 97.8 F (36.6 C)     Temp Source 07/01/22 1114 Oral     SpO2 07/01/22 1110 99 %     Weight 07/01/22 1112 115 lb (52.2 kg)     Height 07/01/22 1112 '5\' 2"'$  (1.575 m)     Head Circumference --      Peak Flow --      Pain Score 07/01/22 1112 0     Pain Loc --      Pain Edu? --       Excl. in [place]? --     Most recent vital signs: Vitals:   07/01/22 1114 07/01/22 1230  BP: (!) 206/98 (!) 172/84  Pulse: 81 76  Resp: 18 17  Temp: 97.8 F (36.6 C)   SpO2: 99% 99%    General: Awake, no distress.  CV:  Good peripheral perfusion.  Resp:  Normal effort.  Abd:  No distention.  Other:  Awake alert comfortable appearing hypertensive 200s over 90s otherwise vital signs within normal limits.  Lungs clear without focality or wheezing, abdomen soft and nontender and she has no focal neurologic deficits including dysarthria, visual cuts, facial asymmetry, motor or sensory deficits to all extremities and she has normal finger-to-nose.  I did not ambulate her.   ED Results / Procedures / Treatments   Labs (all labs ordered are listed, but only abnormal results are displayed) Labs Reviewed  COMPREHENSIVE METABOLIC PANEL - Abnormal; Notable for the following components:      Result Value   Glucose, Bld 100 (*)    All other components within normal limits  CBC WITH DIFFERENTIAL/PLATELET - Abnormal; Notable for the following components:   Platelets 139 (*)    All other components within normal limits  RESP PANEL BY RT-PCR (RSV, FLU A&B, COVID)  RVPGX2  LACTIC ACID, PLASMA  MAGNESIUM  LACTIC ACID, PLASMA  URINALYSIS, W/ REFLEX TO CULTURE (INFECTION SUSPECTED)  TROPONIN I (HIGH SENSITIVITY)     I ordered and reviewed the above labs they are notable for normal cell counts  EKG  ED ECG REPORT I, Lucillie Garfinkel, the attending physician, personally viewed and interpreted this ECG.   Date: 07/01/2022  EKG Time: 1114  Rate: 83  Rhythm: sinus  Axis: nl  Intervals: Nonspecific intraventricular conduction delay  ST&T Change: No stemi    RADIOLOGY I independently reviewed and interpreted chest x-ray and see no obvious focality or pneumothorax   PROCEDURES:  Critical Care performed: No  Procedures   MEDICATIONS ORDERED IN ED: Medications - No data to  display  External physician / consultants:  I spoke with hospitalist for admission and regarding care plan for this patient.   IMPRESSION / MDM / ASSESSMENT AND PLAN / ED COURSE  I reviewed the triage vital signs and the nursing notes.                                Patient's presentation is most consistent with acute presentation with potential threat to life or bodily function.  Differential diagnosis includes, but is not limited to, hypertensive crisis, CVA, intracranial bleeding, electrolyte disturbance, dehydration, respiratory infection or urinary tract infection   The patient is on the cardiac monitor to evaluate for evidence of arrhythmia and/or significant heart rate changes.  MDM: This is a patient with dizziness and paresthesias to the hands as well outside of the stroke window for thrombolytics and who is markedly hypertensive after discontinuing her antihypertensives for the past 2 weeks.  She had a minor fall and denies any significant injuries from this fall.  She has no focal pain or infectious symptoms.  Plan will be for metabolic/infectious workup with basic labs urinalysis and chest x-ray as well as viral swabs.  Check CT of the head for intracranial bleeding and CT of the C-spine given her recent fall and age.    Right-sided PCA acute to subacute infarct on CT scan without contrast.  I ordered an MRI brain without contrast, patient is well outside of the thrombolytic window with approximately 3 days of symptoms.  Her blood pressure without intervention is 170/80.  She will be admitted.        FINAL CLINICAL IMPRESSION(S) / ED DIAGNOSES   Final diagnoses:  Acute right PCA stroke ([place])     Rx / DC Orders   ED Discharge Orders     None        Note:  This document was prepared using Dragon voice recognition software and may include unintentional dictation errors.    Lucillie Garfinkel, MD 07/01/22 1240

## 2022-07-01 NOTE — H&P (Signed)
History and Physical    [name] [name] [id number] DOB: 1927/09/15 DOA: 07/01/2022  Referring MD/NP/PA:   PCP: Derinda Late, MD   Patient coming from:  The patient is coming from home.        Chief Complaint: dizziness, left leg weakness, decreased sensation in both hands hands, numbness in both feet.  HPI: [name] [name] is a 89 y.o. female with medical history significant of HTN, macular degeneration, who presents with dizziness, left leg weakness, decreased sensation in both hands hands, numbness in both feet.    Per patient and her daughter at the bedside, patient started having some nausea 3 days ago, no vomiting, diarrhea or abdominal pain.  Yesterday morning, patient started having dizziness and lightheadedness, developed weakness in left leg, then developed decreased sensation in both hands and feet.  Both feet are numb.  Patient fell yesterday without significant injury.  No loss of consciousness.  Patient does not have chest pain, cough, shortness breath.  No symptoms of UTI.   Data reviewed independently and ED Course: pt was found to have WBC 4.2, GFR> 60, temperature normal, blood pressure 106/98, 172/84, heart rate 81, RR 18, oxygen saturation 99% on room air.  Chest x-ray showed right basilar atelectasis.  Patient is admitted to telemetry bed as inpatient.  Dr. Curly Shores of neurology is consulted.  CT-head and C-spin 1. Acute to subacute right PCA infarct. 2. No acute cervical spine fracture. 3. Advanced cervical disc and facet degeneration. 4. 2 cm incidental right thyroid nodule. Recommend non-emergent thyroid ultrasound if clinically warranted given patient age. Reference: J Am Coll Radiol. 2015 Feb;12(2): 143-50   EKG: I have personally reviewed. Sinus rhythm, LAD, poor R wave progression, PAC  Review of Systems:   General: no fevers, chills, no body weight gain,  has fatigue HEENT: no blurry vision, hearing changes or sore throat Respiratory: no dyspnea,  coughing, wheezing CV: no chest pain, no palpitations GI: has nausea, no vomiting, abdominal pain, diarrhea, constipation GU: no dysuria, burning on urination, increased urinary frequency, hematuria  Ext: no leg edema Neuro: has dizziness, left leg weakness, decreased sensation in both hands hands, numbness in both feet. Has fall Skin: no rash, no skin tear. MSK: No muscle spasm, no deformity, no limitation of range of movement in spin Heme: No easy bruising.  Travel history: No recent long distant travel.   Allergy: No Known Allergies  Past Medical History:  Diagnosis Date   Depression    HTN (hypertension)    Macular degeneration     Past Surgical History:  Procedure Laterality Date   BREAST CYST ASPIRATION Right 2006   benign   BREAST EXCISIONAL BIOPSY Right 1995   benign    Social History:  reports that she has never smoked. She has never used smokeless tobacco. She reports that she does not drink alcohol and does not use drugs.  Family History:  Family History  Problem Relation Age of Onset   Cancer Mother    CVA Father    Liver disease Sister      Prior to Admission medications   Medication Sig Start Date End Date Taking? Authorizing Provider  Acetaminophen 500 MG capsule Take 1 tablet by mouth every 4 (four) hours as needed.    [provider]  bisoprolol (ZEBETA) 5 MG tablet Take 1 tablet by mouth daily. 11/15/20   [provider]  hydroxypropyl methylcellulose / hypromellose (ISOPTO TEARS / GONIOVISC) 2.5 % ophthalmic solution 1 drop as needed for dry  eyes.    [provider]  Melatonin 1 MG CHEW Chew 2 mg by mouth at bedtime. Patient not taking: Reported on 03/08/2022    [provider]  mirtazapine (REMERON) 7.5 MG tablet Take 7.5 mg by mouth at bedtime. 04/21/21   [provider]  Multiple Vitamins-Minerals (PRESERVISION AREDS 2 PO) Take 1 tablet by mouth 2 (two) times daily.    [provider]     Physical Exam: Vitals:   07/01/22 1112 07/01/22 1114 07/01/22 1230 07/01/22 1533  BP:  (!) 206/98 (!) 172/84 (!) 185/94  Pulse:  81 76 80  Resp:  '18 17 18  '$ Temp:  97.8 F (36.6 C)  98.1 F (36.7 C)  TempSrc:  Oral  Oral  SpO2:  99% 99% 100%  Weight: 52.2 kg     Height: '5\' 2"'$  ([id number] m)      General: Not in acute distress HEENT:       Eyes: PERRL, EOMI, no scleral icterus.       ENT: No discharge from the ears and nose, no pharynx injection, no tonsillar enlargement.        Neck: No JVD, no bruit, no mass felt. Heme: No neck lymph node enlargement. Cardiac: S1/S2, RRR, No murmurs, No gallops or rubs. Respiratory: No rales, wheezing, rhonchi or rubs. GI: Soft, nondistended, nontender, no rebound pain, no organomegaly, BS present. GU: No hematuria Ext: No pitting leg edema bilaterally. 1+DP/PT pulse bilaterally. Musculoskeletal: No joint deformities, No joint redness or warmth, no limitation of ROM in spin. Skin: No rashes.  Neuro: Alert, oriented X3, cranial nerves II-XII grossly intact, moves all extremities normally. Muscle strength 5/5 in all extremities, sensation to light touch is decreased in both hands  Psych: Patient is not psychotic, no suicidal or hemocidal ideation.  Labs on Admission: I have personally reviewed following labs and imaging studies  CBC: Recent Labs  Lab 07/01/22 1138  WBC 4.2  NEUTROABS 2.5  HGB 13.5  HCT 41.5  MCV 93.0  PLT [id number]*   Basic Metabolic Panel: Recent Labs  Lab 07/01/22 1138  NA 140  K 3.7  CL 105  CO2 24  GLUCOSE 100*  BUN 13  CREATININE 0.74  CALCIUM 9.2  MG 2.2   GFR: Estimated Creatinine Clearance: 34.7 mL/min (by C-G formula based on SCr of 0.74 mg/dL). Liver Function Tests: Recent Labs  Lab 07/01/22 1138  AST 24  ALT 14  ALKPHOS 71  BILITOT 0.7  PROT 6.6  ALBUMIN 4.1   No results for input(s): "LIPASE", "AMYLASE" in the last 168 hours. No results for input(s): "AMMONIA" in the last 168  hours. Coagulation Profile: No results for input(s): "INR", "PROTIME" in the last 168 hours. Cardiac Enzymes: No results for input(s): "CKTOTAL", "CKMB", "CKMBINDEX", "TROPONINI" in the last 168 hours. BNP (last 3 results) No results for input(s): "PROBNP" in the last 8760 hours. HbA1C: No results for input(s): "HGBA1C" in the last 72 hours. CBG: No results for input(s): "GLUCAP" in the last 168 hours. Lipid Profile: No results for input(s): "CHOL", "HDL", "LDLCALC", "TRIG", "CHOLHDL", "LDLDIRECT" in the last 72 hours. Thyroid Function Tests: Recent Labs    07/01/22 1444  TSH 1.619   Anemia Panel: No results for input(s): "VITAMINB12", "FOLATE", "FERRITIN", "TIBC", "IRON", "RETICCTPCT" in the last 72 hours. Urine analysis: No results found for: "COLORURINE", "APPEARANCEUR", "LABSPEC", "PHURINE", "GLUCOSEU", "HGBUR", "BILIRUBINUR", "KETONESUR", "PROTEINUR", "UROBILINOGEN", "NITRITE", "LEUKOCYTESUR" Sepsis Labs: '@LABRCNTIP'$ (procalcitonin:4,lacticidven:4) ) Recent Results (from the past 240 hour(s))  Resp panel by RT-PCR (  RSV, Flu A&B, Covid) Anterior Nasal Swab     Status: None   Collection Time: 07/01/22 11:38 AM   Specimen: Anterior Nasal Swab  Result Value Ref Range Status   SARS Coronavirus 2 by RT PCR NEGATIVE NEGATIVE Final    Comment: (NOTE) SARS-CoV-2 target nucleic acids are NOT DETECTED.  The SARS-CoV-2 RNA is generally detectable in upper respiratory specimens during the acute phase of infection. The lowest concentration of SARS-CoV-2 viral copies this assay can detect is 138 copies/mL. A negative result does not preclude SARS-Cov-2 infection and should not be used as the sole basis for treatment or other patient management decisions. A negative result may occur with  improper specimen collection/handling, submission of specimen other than nasopharyngeal swab, presence of viral mutation(s) within the areas targeted by this assay, and inadequate number of  viral copies(<138 copies/mL). A negative result must be combined with clinical observations, patient history, and epidemiological information. The expected result is Negative.  Fact Sheet for Patients:  EntrepreneurPulse.com.au  Fact Sheet for Healthcare Providers:  IncredibleEmployment.be  This test is no t yet approved or cleared by the [place] FDA and  has been authorized for detection and/or diagnosis of SARS-CoV-2 by FDA under an Emergency Use Authorization (EUA). This EUA will remain  in effect (meaning this test can be used) for the duration of the COVID-19 declaration under Section 564(b)(1) of the Act, 21 U.S.C.section 360bbb-3(b)(1), unless the authorization is terminated  or revoked sooner.       Influenza A by PCR NEGATIVE NEGATIVE Final   Influenza B by PCR NEGATIVE NEGATIVE Final    Comment: (NOTE) The Xpert Xpress SARS-CoV-2/FLU/RSV plus assay is intended as an aid in the diagnosis of influenza from Nasopharyngeal swab specimens and should not be used as a sole basis for treatment. Nasal washings and aspirates are unacceptable for Xpert Xpress SARS-CoV-2/FLU/RSV testing.  Fact Sheet for Patients: EntrepreneurPulse.com.au  Fact Sheet for Healthcare Providers: IncredibleEmployment.be  This test is not yet approved or cleared by the [place] FDA and has been authorized for detection and/or diagnosis of SARS-CoV-2 by FDA under an Emergency Use Authorization (EUA). This EUA will remain in effect (meaning this test can be used) for the duration of the COVID-19 declaration under Section 564(b)(1) of the Act, 21 U.S.C. section 360bbb-3(b)(1), unless the authorization is terminated or revoked.     Resp Syncytial Virus by PCR NEGATIVE NEGATIVE Final    Comment: (NOTE) Fact Sheet for Patients: EntrepreneurPulse.com.au  Fact Sheet for Healthcare  Providers: IncredibleEmployment.be  This test is not yet approved or cleared by the [place] FDA and has been authorized for detection and/or diagnosis of SARS-CoV-2 by FDA under an Emergency Use Authorization (EUA). This EUA will remain in effect (meaning this test can be used) for the duration of the COVID-19 declaration under Section 564(b)(1) of the Act, 21 U.S.C. section 360bbb-3(b)(1), unless the authorization is terminated or revoked.  Performed at [facility], [address] [place], [place] [address]      Radiological Exams on Admission: CT VENOGRAM HEAD  Result Date: 07/01/2022 CLINICAL DATA:  Dural venous sinus thrombosis suspected. EXAM: CT VENOGRAM HEAD TECHNIQUE: Venographic phase images of the brain were obtained following the administration of intravenous contrast. Multiplanar reformats and maximum intensity projections were generated. RADIATION DOSE REDUCTION: This exam was performed according to the departmental dose-optimization program which includes automated exposure control, adjustment of the mA and/or kV according to patient size and/or use of iterative reconstruction technique. CONTRAST:  71m OMNIPAQUE IOHEXOL 350 MG/ML SOLN COMPARISON:  None Available. FINDINGS: The superior sagittal sinus, internal cerebral veins, vein of Galen, straight sinus, transverse sinuses, sigmoid sinuses, and jugular bulbs are patent without evidence of thrombus or significant stenosis. IMPRESSION: No evidence of dural venous sinus thrombosis. Electronically Signed   By: ALogan BoresM.D.   On: 07/01/2022 18:45   CT ANGIO HEAD NECK W WO CM  Result Date: 07/01/2022 CLINICAL DATA:  Neuro deficit, acute, stroke suspected. Acute right PCA infarct. EXAM: CT ANGIOGRAPHY HEAD AND NECK TECHNIQUE: Multidetector CT imaging of the head and neck was performed using the standard protocol during bolus administration of intravenous contrast. Multiplanar CT image  reconstructions and MIPs were obtained to evaluate the vascular anatomy. Carotid stenosis measurements (when applicable) are obtained utilizing NASCET criteria, using the distal internal carotid diameter as the denominator. RADIATION DOSE REDUCTION: This exam was performed according to the departmental dose-optimization program which includes automated exposure control, adjustment of the mA and/or kV according to patient size and/or use of iterative reconstruction technique. CONTRAST:  776mOMNIPAQUE IOHEXOL 350 MG/ML SOLN COMPARISON:  None Available. FINDINGS: CTA NECK FINDINGS Aortic arch: Standard 3 vessel aortic arch with mild atherosclerotic plaque. Widely patent brachiocephalic and subclavian arteries. Right carotid system: Patent with a small amount of calcified plaque at the carotid bifurcation. No evidence of a significant stenosis or dissection. Left carotid system: Patent with a small amount of calcified plaque at the carotid bifurcation. No evidence of a significant stenosis or dissection. Vertebral arteries: Patent without evidence of stenosis, dissection, or significant atherosclerosis. Mildly dominant left vertebral artery. Skeleton: Widespread advanced facet arthrosis and disc degeneration in the cervical spine with mild multilevel degenerative listhesis. Other neck: Multiple right thyroid nodules measuring up to approximately 2 cm. No evidence of cervical lymphadenopathy. Upper chest: Biapical pleuroparenchymal lung scarring. Review of the MIP images confirms the above findings CTA HEAD FINDINGS Anterior circulation: The internal carotid arteries are patent from skull base to carotid termini with mild atherosclerosis bilaterally not resulting in significant stenosis. ACAs and MCAs are patent without evidence of a significant A1 or M1 stenosis. There is moderate MCA branch vessel atherosclerosis bilaterally, and there are severe left A2 and bilateral A3 stenoses. No aneurysm identified. Posterior  circulation: Intracranial vertebral arteries are patent to the basilar with mild atheromatous irregularity but no significant stenosis. Patent PICA, AICA, and SCA origins are seen bilaterally. The basilar artery is widely patent. Posterior communicating arteries are diminutive or absent. The PCAs are patent with a severe, near occlusive stenosis of the distal right P2 segment and with were distal branch vessel irregularity noted bilaterally. No aneurysm is identified. Venous sinuses: More fully evaluated on the separate venogram. Anatomic variants: None. Review of the MIP images confirms the above findings IMPRESSION: 1. No emergent large vessel occlusion. 2. Intracranial atherosclerosis including severe distal right P2 and left A2 stenoses. 3. Widely patent cervical carotid and vertebral arteries. 4. 2 cm right thyroid nodule. Recommend non-emergent thyroid ultrasound if clinically warranted given patient age. Reference: J Am Coll Radiol. 2015 Feb;12(2): 143-50 5.  Aortic Atherosclerosis (ICD10-I70.0). Electronically Signed   By: AlLogan Bores.D.   On: 07/01/2022 18:44   MR BRAIN WO CONTRAST  Result Date: 07/01/2022 CLINICAL DATA:  Neuro deficit, acute, stroke suspected. Right PCA infarct on CT. EXAM: MRI HEAD WITHOUT CONTRAST TECHNIQUE: Multiplanar, multiecho pulse sequences of the brain and surrounding structures were obtained without intravenous contrast. COMPARISON:  Head CT 07/01/2022 FINDINGS: Brain: There is an  acute right PCA infarct involving portions of the occipital lobe, posterior temporal lobe including hippocampal tail, splenium of the corpus callosum, and lateral right thalamus. A single focus of chronic microhemorrhage is noted in the right parietal periventricular white matter. T2 hyperintensities elsewhere in the cerebral white matter bilaterally are nonspecific but compatible with mild chronic small vessel ischemic disease. There is generalized cerebral atrophy. No mass, midline shift, or  extra-axial fluid collection is identified. Vascular: Major intracranial vascular flow voids are preserved. Skull and upper cervical spine: Unremarkable bone marrow signal. Sinuses/Orbits: Bilateral cataract extraction. Paranasal sinuses and mastoid air cells are clear. Other: None. IMPRESSION: 1. Acute right PCA infarct. 2. Mild chronic small vessel ischemic disease. Electronically Signed   By: Logan Bores M.D.   On: 07/01/2022 18:04   CT Head Wo Contrast  Result Date: 07/01/2022 CLINICAL DATA:  Head trauma, minor (Age >= 65y); Neck trauma (Age >= 65y). Fall yesterday. EXAM: CT HEAD WITHOUT CONTRAST CT CERVICAL SPINE WITHOUT CONTRAST TECHNIQUE: Multidetector CT imaging of the head and cervical spine was performed following the standard protocol without intravenous contrast. Multiplanar CT image reconstructions of the cervical spine were also generated. RADIATION DOSE REDUCTION: This exam was performed according to the departmental dose-optimization program which includes automated exposure control, adjustment of the mA and/or kV according to patient size and/or use of iterative reconstruction technique. COMPARISON:  None Available. FINDINGS: CT HEAD FINDINGS Brain: A 3 cm region of confluent hypodensity involving cortex and white matter in the right occipital lobe is consistent with an acute to subacute PCA infarct. No intracranial mass effect, acute intracranial hemorrhage, or extra-axial fluid collection is identified. Hypodensities elsewhere in the cerebral white matter bilaterally are nonspecific but compatible with mild chronic small vessel ischemic disease. Cerebral atrophy is mild for age. Vascular: Calcified atherosclerosis at the skull base. Small calcification at the basilar tip. Skull: No acute fracture or suspicious osseous lesion. Sinuses/Orbits: Visualized paranasal sinuses and mastoid air cells are clear. Bilateral cataract extraction. Other: None. CT CERVICAL SPINE FINDINGS Alignment: Reversal  of the normal cervical lordosis. Grade 1 anterolisthesis of C3 on C4, C4 on C5, C5 on C6, C7 on T1, T1 on T2, and T2 on T3. Mild right convex curvature of the cervical spine. Skull base and vertebrae: No acute fracture or suspicious osseous lesion. Asymmetrically advanced left-sided C1-2 arthropathy. Interbody and facet ankylosis at C6-7. Bridging anterior vertebral osteophytes at C7-T1 and T1-2. Soft tissues and spinal canal: No prevertebral fluid or swelling. No visible canal hematoma. Disc levels: Widespread disc degeneration, most advanced at C5-6. Widespread severe facet arthrosis throughout the cervical and included upper thoracic spine. Advanced neural foraminal stenosis bilaterally at C3-4 and C4-5 and on the left at C5-6. Suspected moderate spinal stenosis at C3-4. Upper chest: Biapical pleuroparenchymal lung scarring. Other: Right thyroid nodules measuring up to 2 cm. IMPRESSION: 1. Acute to subacute right PCA infarct. 2. No acute cervical spine fracture. 3. Advanced cervical disc and facet degeneration. 4. 2 cm incidental right thyroid nodule. Recommend non-emergent thyroid ultrasound if clinically warranted given patient age. Reference: J Am Coll Radiol. 2015 Feb;12(2): 143-50 Electronically Signed   By: Logan Bores M.D.   On: 07/01/2022 12:11   CT Cervical Spine Wo Contrast  Result Date: 07/01/2022 CLINICAL DATA:  Head trauma, minor (Age >= 65y); Neck trauma (Age >= 65y). Fall yesterday. EXAM: CT HEAD WITHOUT CONTRAST CT CERVICAL SPINE WITHOUT CONTRAST TECHNIQUE: Multidetector CT imaging of the head and cervical spine was performed following the standard protocol  without intravenous contrast. Multiplanar CT image reconstructions of the cervical spine were also generated. RADIATION DOSE REDUCTION: This exam was performed according to the departmental dose-optimization program which includes automated exposure control, adjustment of the mA and/or kV according to patient size and/or use of iterative  reconstruction technique. COMPARISON:  None Available. FINDINGS: CT HEAD FINDINGS Brain: A 3 cm region of confluent hypodensity involving cortex and white matter in the right occipital lobe is consistent with an acute to subacute PCA infarct. No intracranial mass effect, acute intracranial hemorrhage, or extra-axial fluid collection is identified. Hypodensities elsewhere in the cerebral white matter bilaterally are nonspecific but compatible with mild chronic small vessel ischemic disease. Cerebral atrophy is mild for age. Vascular: Calcified atherosclerosis at the skull base. Small calcification at the basilar tip. Skull: No acute fracture or suspicious osseous lesion. Sinuses/Orbits: Visualized paranasal sinuses and mastoid air cells are clear. Bilateral cataract extraction. Other: None. CT CERVICAL SPINE FINDINGS Alignment: Reversal of the normal cervical lordosis. Grade 1 anterolisthesis of C3 on C4, C4 on C5, C5 on C6, C7 on T1, T1 on T2, and T2 on T3. Mild right convex curvature of the cervical spine. Skull base and vertebrae: No acute fracture or suspicious osseous lesion. Asymmetrically advanced left-sided C1-2 arthropathy. Interbody and facet ankylosis at C6-7. Bridging anterior vertebral osteophytes at C7-T1 and T1-2. Soft tissues and spinal canal: No prevertebral fluid or swelling. No visible canal hematoma. Disc levels: Widespread disc degeneration, most advanced at C5-6. Widespread severe facet arthrosis throughout the cervical and included upper thoracic spine. Advanced neural foraminal stenosis bilaterally at C3-4 and C4-5 and on the left at C5-6. Suspected moderate spinal stenosis at C3-4. Upper chest: Biapical pleuroparenchymal lung scarring. Other: Right thyroid nodules measuring up to 2 cm. IMPRESSION: 1. Acute to subacute right PCA infarct. 2. No acute cervical spine fracture. 3. Advanced cervical disc and facet degeneration. 4. 2 cm incidental right thyroid nodule. Recommend non-emergent thyroid  ultrasound if clinically warranted given patient age. Reference: J Am Coll Radiol. 2015 Feb;12(2): 143-50 Electronically Signed   By: Logan Bores M.D.   On: 07/01/2022 12:11   DG Chest Port 1 View  Result Date: 07/01/2022 CLINICAL DATA:  Weakness. EXAM: PORTABLE CHEST 1 VIEW COMPARISON:  09/25/2004 radiograph FINDINGS: The cardiomediastinal silhouette is unremarkable. Mild RIGHT basilar subsegmental atelectasis/scarring noted. There is no evidence of focal airspace disease, pulmonary edema, suspicious pulmonary nodule/mass, pleural effusion, or pneumothorax. No acute bony abnormalities are identified. IMPRESSION: Mild RIGHT basilar subsegmental atelectasis/scarring. No other acute abnormality. Electronically Signed   By: Margarette Canada M.D.   On: 07/01/2022 11:44      Assessment/Plan Principal Problem:   Stroke [facility]) Active Problems:   HTN (hypertension)   Depression   Fall at home, initial encounter   Thyroid nodule   Assessment and Plan:   Stroke [facility]): CT-head showed acute to subacute right PCA infarct. Consulted Dr. Curly Shores of neuro.   -Admit to tele med bed as inpatient -Obtain MRI-brain, CTA of neck and head and CT venogram of head per Dr. Curly Shores -Start Lipitor 40 mg daily -Plavix plus aspirin without Bhagat -Patient is out of window for permissive hypertension - fasting lipid panel and HbA1c  - 2D transthoracic echocardiography  - swallowing screen. If fails, will get SLP - PT/OT consult  HTN (hypertension): Blood pressure 260/98, 172/8 84.  Patient used to be on Zebeta which has been on hold recently due to low blood pressure. -restart Zebeta -IV hydralazine for SBP > 175  Depression -  Mirtazapine  Fall at home, initial encounter -Fall precaution -PT/OT  Thyroid nodule: This is incidental findings by CT scan. -Checking TSH, free T3/4 -Thyroid ultrasound      DVT ppx:  SQ Lovenox  Code Status: DNR per pt and her granddaughter   Family Communication:   Yes,  patient's granddaughter   at bed side.      Disposition Plan:  Anticipate discharge back to previous environment  Consults called:  Dr. Curly Shores of neuro  Admission status and Level of care: Telemetry Medical:     as inpt       Dispo: The patient is from: Home              Anticipated d/c is to: Home              Anticipated d/c date is: 2 days              Patient currently is not medically stable to d/c.    Severity of Illness:  The appropriate patient status for this patient is INPATIENT. Inpatient status is judged to be reasonable and necessary in order to provide the required intensity of service to ensure the patient's safety. The patient's presenting symptoms, physical exam findings, and initial radiographic and laboratory data in the context of their chronic comorbidities is felt to place them at high risk for further clinical deterioration. Furthermore, it is not anticipated that the patient will be medically stable for discharge from the hospital within 2 midnights of admission.   * I certify that at the point of admission it is my clinical judgment that the patient will require inpatient hospital care spanning beyond 2 midnights from the point of admission due to high intensity of service, high risk for further deterioration and high frequency of surveillance required.*       Date of Service 07/01/2022    Ivor Costa [facility]   If 7PM-7AM, please contact night-coverage [email or web address] 07/01/2022, 6:56 PM

## 2022-07-01 NOTE — ED Notes (Signed)
^  A: stroke   Lucillie Garfinkel, MD 07/01/22 1311

## 2022-07-01 NOTE — Plan of Care (Signed)
Patient admitted today from the ED. BP 185/94. Rest of VSS. Upon arrival to 1C patient ambulated with 1 assist to the bathroom.  Oriented x4.  NIH 2 for left eye partial hemianopia and tingling to both hands and feet.  Per family patient with impaired vision to left eye since 12/2021. Also reports of mild dizziness. Denies headache or pain. Ativan given for anxiety prior to MRI.

## 2022-07-01 NOTE — Consult Note (Addendum)
Neurology Consultation Reason for Consult: Stroke on head CT Requesting Physician: Ivor Costa  CC: Dizziness, left leg weakness  History is obtained from: Patient, family at bedside and chart review  HPI: [name] [name] is a 89 y.o. female with a past medical history significant for hypertension, diverticulosis, restless leg syndrome, prediabetes (A1c 5.8%), osteoporosis, wet macular degeneration, anemia and mild thrombocytopenia.  Of note, due to dizziness/falls her blood pressure medication was being reduced by her outpatient provider.  She had gone to half a dose in October but then subsequent to a bad fall had been taking completely off of her blood pressure medications in early January.  At baseline she lives independently but does have a fair amount of support from family and friends.  She uses a washcloth to bathe herself on a daily basis and still cooks (baking cakes and cookies mostly), but has help for a full shower from family on Monday and Friday.  She also has a family member who stays with her Saturday nights to take her to church on Sunday morning.  She does use a walker but is independent with it  She had just eaten a special meal for normally but they celebration on Friday when afterwards she began to have what she thought was some GERD symptoms.  However she has continued to be dizzy since then, and has episodically had trouble using her left lower extremity and complained of tingling in her bilateral hands and feet.  However on specific questioning she does note that the left leg feels more numb (she is inconsistent in this report).  More than any vision issues family has felt that she has had difficulty hearing/processing what they are asking her to do and has seemed a little confused.  Subacute stroke was seen on head CT and neurology was consulted for further recommendations  LKW: Friday 3/8 afternoon Thrombolytic given?: No, out of the window IA performed?: No, out of the  window Premorbid modified rankin scale: 3-4     3 - Moderate disability. Requires some help, but able to walk unassisted.     4 - Moderately severe disability. Unable to attend to own bodily needs without assistance, and unable to walk unassisted.  ROS: All other review of systems was negative except as noted in the HPI.   Past Medical History:  Diagnosis Date   Depression    HTN (hypertension)    Macular degeneration    Family History  Problem Relation Age of Onset   Cancer Mother    CVA Father    Liver disease Sister    Current Outpatient Medications  Medication Instructions   Acetaminophen 500 MG capsule 1 tablet, Oral, Every 4 hours PRN   bisoprolol (ZEBETA) 5 MG tablet 1 tablet, Oral, Daily   hydroxypropyl methylcellulose / hypromellose (ISOPTO TEARS / GONIOVISC) 2.5 % ophthalmic solution 1 drop, As needed   Melatonin 2 mg, Nightly   mirtazapine (REMERON) 7.5 mg, Oral, Daily at bedtime   Multiple Vitamins-Minerals (PRESERVISION AREDS 2 PO) 1 tablet, Oral, 2 times daily   She has been off of bisoprolol since January, and had previously been on a half dose since October  Social History:  has no history on file for tobacco use, alcohol use, and drug use.  Exam: Current vital signs: BP (!) 172/84   Pulse 76   Temp 97.8 F (36.6 C) (Oral)   Resp 17   Ht '5\' 2"'$  (1.575 m)   Wt 52.2 kg   SpO2 99%  BMI 21.03 kg/m  Vital signs in last 24 hours: Temp:  [97.8 F (36.6 C)] 97.8 F (36.6 C) (03/10 1114) Pulse Rate:  [76-81] 76 (03/10 1230) Resp:  [17-18] 17 (03/10 1230) BP: (172-206)/(84-98) 172/84 (03/10 1230) SpO2:  [99 %] 99 % (03/10 1230) Weight:  [52.2 kg] 52.2 kg (03/10 1112)   Physical Exam  Constitutional: Appears thin but well-appearing Psych: Affect appropriate to situation pleasant and cooperative, occasionally slightly confused appearing Eyes: No scleral injection HENT: No oropharyngeal obstruction.  MSK: no joint deformities.  Cardiovascular: Perfusing  extremities well Respiratory: Effort normal, non-labored breathing GI: Soft.  No distension. There is no tenderness.  Skin: Warm dry and intact visible skin  Neuro: Mental Status: Patient is awake, alert, oriented to person, place, month, age but not year (reports it is 75 instead of 20[birth year]), and situation. Patient is able to give a clear and coherent history, though a little inconsistent on some details such as left leg numbness No signs of aphasia or neglect Cranial Nerves: II: Visual Fields are notable for possible left-sided hemianopia on a background of macular degeneration making the testing more challenging. Pupils are equal, round, and reactive to light.   III,IV, VI: EOMI with slight ptosis of the right eye, no diplopia V: Facial sensation is symmetric to temperature and light touch VII: Facial movement is symmetric.  VIII: hearing is intact to voice X: Uvula elevates symmetrically XI: Shoulder shrug is symmetric. XII: tongue is midline without atrophy or fasciculations.  Motor: Tone is normal. Bulk is normal. 5/5 strength was present in all four extremities, other than mild pain limitation in the right deltoid 4+/5, bilateral mild hip flexor weakness, symmetric Sensory: Inconsistent report of perhaps some slight sensory loss in the left leg Deep Tendon Reflexes: 3+ in the brachioradialis and patellae bilaterally, but slightly brisker on the left than the right.  Plantars: Mute toes bilaterally Cerebellar: Finger-nose intact bilaterally but heel-to-shin is ataxic in the left lower extremity Gait:  Slightly broad-based shuffling gait, essentially at her baseline per family  NIHSS total 2 Score breakdown: 1 point for left lower extremity ataxia, 1 point for left lower extremity sensation loss (reported intermittently) Performed at 3 PM  I have reviewed labs in epic and the results pertinent to this consultation are:  Basic Metabolic Panel: Recent Labs  Lab  07/01/22 1138  NA 140  K 3.7  CL 105  CO2 24  GLUCOSE 100*  BUN 13  CREATININE 0.74  CALCIUM 9.2  MG 2.2    CBC: Recent Labs  Lab 07/01/22 1138  WBC 4.2  NEUTROABS 2.5  HGB 13.5  HCT 41.5  MCV 93.0  PLT 139*    Coagulation Studies: No results for input(s): "LABPROT", "INR" in the last 72 hours.    Ref Range & Units 2 mo ago  Cholesterol, Total 100 - 200 mg/dL 164  Triglyceride 35 - 199 mg/dL 100  HDL (High Density Lipoprotein) Cholesterol 35.0 - 85.0 mg/dL 57.3  LDL Calculated 0 - 130 mg/dL 87  VLDL Cholesterol mg/dL 20  Cholesterol/HDL Ratio  2.9   I have reviewed the images obtained:  Head CT and CT C-spine personally reviewed, agree with radiology:   1. Acute to subacute right PCA infarct. 2. No acute cervical spine fracture. 3. Advanced cervical disc and facet degeneration. 4. 2 cm incidental right thyroid nodule. Recommend non-emergent thyroid ultrasound if clinically warranted given patient age. Reference: J Am Coll Radiol. 2015 Feb;12(2): 143-50  CXR: Mild RIGHT basilar subsegmental  atelectasis/scarring. No other acute  abnormality.  Of note she presented [facility] and was found to have a chronic left internal jugular vein occlusion; images are not available to me but the report follows: "No evidence of dissection or aneurysm of the carotid or vertebral arteries.  Complete opacification of the entire left internal jugular vein compatible with thrombus. Dilation of the right internal jugular vein up to 2.9 cm.  Suspicious 1.6 cm right inferior thyroid lobe nodule which can be further evaluated, if clinically desired, with thyroid ultrasound.  Attending Clarifying - The left internal jugular vein is non-opacified with contrast and appears chronically thrombosed due to the presence of extensive collaterals on the left. "  Impression: 89 year old presenting with acute onset dizziness concerning for posterior circulation stroke with head CT  demonstrating likely acute to subacute right PCA infarct though she seems minimally symptomatic from this perhaps due to difficulty noticing a new visual field deficit on a background of macular degeneration.  Her thyroid nodule was reported as only 1.6 cm a few months ago, and cannot directly compare images given these were obtained at a different facility.  Will check TSH in addition to other stroke labs.  Additionally she was incidentally found to have a chronic jugular vein occlusion on imaging in October at [facility].  Will characterize this further with CT venogram as CVST can present with atypical stroke symptoms, though her lack of headache makes CVST less likely  Recommendations: # Right PCA stroke, etiology to be determined pending workup - Stroke labs HgbA1c, fasting lipid panel, TSH given potentially enlarging thyroid nodule - MRI brain  - CTA head and neck  - CTV - Frequent neuro checks per stroke protocol - Echocardiogram - Prophylactic therapy-Antiplatelet med: Aspirin - dose '325mg'$  PO or '300mg'$  PR, followed by 81 mg daily - Consider Plavix 300 mg load with 75 mg daily for 21 - 90 day course, pending CT venogram result (as significant concern for progressive/new venous thrombosis could require anticoagulation rather than dual antiplatelet therapy) - Risk factor modification - Telemetry monitoring - Blood pressure goal   - Normotension to be achieved gradually over 3-5 days; out of the permissive hypertension window - PT consult, OT consult, Speech consult - Routine EEG - Neurology team to follow  Lesleigh Noe MD-PhD [facility] [phone] [facility] coverage for [facility] is from 8 AM to 4 AM in-house and 4 PM to 8 PM by telephone/video. 8 PM to 8 AM emergent questions or overnight urgent questions should be addressed to Teleneurology On-call or [facility] neurohospitalist; contact information can be found on AMION  Recommendations conveyed to primary team via  secure chat.  Family and patient updated at bedside and all of their questions were answered.

## 2022-07-01 NOTE — ED Notes (Signed)
Patient transported to CT 

## 2022-07-02 ENCOUNTER — Inpatient Hospital Stay: Payer: Medicare HMO

## 2022-07-02 ENCOUNTER — Inpatient Hospital Stay (HOSPITAL_COMMUNITY)
Admit: 2022-07-02 | Discharge: 2022-07-02 | Disposition: A | Discharge status: Home / Self Care | Payer: Medicare HMO | Attending: Internal Medicine | Admitting: Internal Medicine

## 2022-07-02 DIAGNOSIS — I6389 Other cerebral infarction: Secondary | ICD-10-CM | POA: Diagnosis not present

## 2022-07-02 DIAGNOSIS — I82C29 Chronic embolism and thrombosis of unspecified internal jugular vein: Secondary | ICD-10-CM

## 2022-07-02 DIAGNOSIS — R4182 Altered mental status, unspecified: Secondary | ICD-10-CM | POA: Diagnosis not present

## 2022-07-02 DIAGNOSIS — I679 Cerebrovascular disease, unspecified: Secondary | ICD-10-CM | POA: Diagnosis not present

## 2022-07-02 DIAGNOSIS — I639 Cerebral infarction, unspecified: Secondary | ICD-10-CM | POA: Diagnosis not present

## 2022-07-02 LAB — LIPID PANEL
Cholesterol: 148 mg/dL (ref 0–200)
HDL: 60 mg/dL (ref >40)
LDL Cholesterol: 71 mg/dL (ref 0–99)
Total CHOL/HDL Ratio: 2.5 RATIO
Triglycerides: 86 mg/dL (ref <150)
VLDL: 17 mg/dL (ref 0–40)

## 2022-07-02 LAB — ECHOCARDIOGRAM COMPLETE
AR max vel: 2.34 cm2
AV Area VTI: 2.39 cm2
AV Area mean vel: 2.28 cm2
AV Mean grad: 3 mmHg
AV Peak grad: 6 mmHg
Ao pk vel: 1.22 m/s
Area-P 1/2: 2.79 cm2
Height: 62 in
MV VTI: 1.9 cm2
S' Lateral: 2.7 cm
Weight: 1840 oz

## 2022-07-02 LAB — URINALYSIS, W/ REFLEX TO CULTURE (INFECTION SUSPECTED)
Bilirubin Urine: NEGATIVE
Glucose, UA: NEGATIVE mg/dL
Hgb urine dipstick: NEGATIVE
Ketones, ur: NEGATIVE mg/dL
Nitrite: NEGATIVE
Protein, ur: NEGATIVE mg/dL
Specific Gravity, Urine: 1.014 (ref 1.005–1.030)
pH: 7 (ref 5.0–8.0)

## 2022-07-02 LAB — GLUCOSE, CAPILLARY: Glucose-Capillary: 102 mg/dL — ABNORMAL HIGH (ref 70–99)

## 2022-07-02 LAB — HEMOGLOBIN A1C
Hgb A1c MFr Bld: 5.6 % (ref 4.8–5.6)
Mean Plasma Glucose: 114 mg/dL

## 2022-07-02 MED ORDER — ENOXAPARIN SODIUM 40 MG/0.4ML IJ SOSY
40.0000 mg | PREFILLED_SYRINGE | INTRAMUSCULAR | Status: DC
  Administered 2022-07-02 – 2022-07-03 (×2): 40 mg via SUBCUTANEOUS
  Filled 2022-07-02 (×2): qty 0.4

## 2022-07-02 MED ORDER — ATORVASTATIN CALCIUM 20 MG PO TABS
20.0000 mg | ORAL_TABLET | Freq: Every day | ORAL | Status: DC
  Administered 2022-07-03 – 2022-07-04 (×2): 20 mg via ORAL
  Filled 2022-07-02 (×2): qty 1

## 2022-07-02 MED ORDER — HALOPERIDOL LACTATE 5 MG/ML IJ SOLN
2.0000 mg | Freq: Once | INTRAMUSCULAR | Status: AC
  Administered 2022-07-02: 2 mg via INTRAVENOUS
  Filled 2022-07-02: qty 1

## 2022-07-02 NOTE — Procedures (Signed)
Routine EEG Report  [name] [name] is a 89 y.o. female with a history of altered mental status who is undergoing an EEG to evaluate for seizures.  Report: This EEG was acquired with electrodes placed according to the International 10-20 electrode system (including Fp1, Fp2, F3, F4, C3, C4, P3, P4, O1, O2, T3, T4, T5, T6, A1, A2, Fz, Cz, Pz). The following electrodes were missing or displaced: none.  The occipital dominant rhythm was 6-7 Hz. This activity is reactive to stimulation. Drowsiness was manifested by background fragmentation; deeper stages of sleep were identified by K complexes and sleep spindles. There was no focal slowing. There were no interictal epileptiform discharges. There were no electrographic seizures identified. Photic stimulation and hyperventilation were not performed.  Impression and clinical correlation: This EEG was obtained while awake and asleep and is abnormal due to mild diffuse slowing indicative of global cerebral dysfunction. Epileptiform abnormalities were not seen during this recording.  Su Monks, MD [facility] [phone]  If 7pm- 7am, please page neurology on call as listed in [place].

## 2022-07-02 NOTE — Progress Notes (Signed)
Eeg done 

## 2022-07-02 NOTE — Progress Notes (Signed)
*  PRELIMINARY RESULTS* Echocardiogram 2D Echocardiogram has been performed.  [name] [name] 07/02/2022, 4:01 PM

## 2022-07-02 NOTE — Progress Notes (Signed)
Neurology f/u note  S: Dizziness has improves. Now reports some trouble with vision in L side of L eye. MRI brain showed a R PCA ischemic infarct.  O:  Vitals:   07/02/22 0538 07/02/22 0818  BP: (!) 180/88 (!) 171/82  Pulse: 63 80  Resp: 17 18  Temp: 97.6 F (36.4 C)   SpO2: 98% 94%    Physical Exam  Constitutional: Appears thin but well-appearing Psych: Affect appropriate to situation pleasant and cooperative, occasionally slightly confused appearing Eyes: No scleral injection HENT: No oropharyngeal obstruction.  MSK: no joint deformities.  Cardiovascular: Perfusing extremities well Respiratory: Effort normal, non-labored breathing GI: Soft.  No distension. There is no tenderness.  Skin: Warm dry and intact visible skin   Neuro: Mental Status: Patient is awake, alert, oriented to person, place, month, age but not year  Patient is able to give a clear and coherent history, though a little inconsistent on some details such as left leg numbness No signs of aphasia or neglect Cranial Nerves: II: Pupils are equal, round, and reactive to light. VF: poorly reproducible L homonymous hemianopsia (exam more difficult 2/2 pre-existing macular degeneration).  III,IV, VI: EOMI with slight ptosis of the right eye, no diplopia V: Facial sensation is symmetric to temperature and light touch VII: Facial movement is symmetric.  VIII: hearing is intact to voice X: Uvula elevates symmetrically XI: Shoulder shrug is symmetric. XII: tongue is midline without atrophy or fasciculations.  Motor: Tone is normal. Bulk is normal. 5/5 strength was present in all four extremities, other than mild pain limitation in the right deltoid 4+/5, bilateral mild hip flexor weakness, symmetric Sensory: Inconsistent report of perhaps some slight sensory loss in the left leg Deep Tendon Reflexes: 3+ in the brachioradialis and patellae bilaterally, but slightly brisker on the left than the right.   Plantars: Mute toes bilaterally Cerebellar: Finger-nose intact bilaterally but heel-to-shin is ataxic in the left lower extremity Gait:  deferred   NIHSS total 3 Score breakdown: 1 point for left lower extremity ataxia, 1 point for left lower extremity sensation loss (reported intermittently), L homonymous hemianopsia  Data:   CT head CT c spine  1. Acute to subacute right PCA infarct. 2. No acute cervical spine fracture. 3. Advanced cervical disc and facet degeneration. 4. 2 cm incidental right thyroid nodule. Recommend non-emergent thyroid ultrasound if clinically warranted given patient age.  CTA H&N  1. No emergent large vessel occlusion. 2. Intracranial atherosclerosis including severe distal right P2 and left A2 stenoses. 3. Widely patent cervical carotid and vertebral arteries. 4. 2 cm right thyroid nodule. Recommend non-emergent thyroid ultrasound if clinically warranted given patient age. Reference: J Am Coll Radiol. 2015 Feb;12(2): 143-50 5.  Aortic Atherosclerosis (ICD10-I70.0).  MRI brain  1. Acute right PCA infarct. 2. Mild chronic small vessel ischemic disease.  CT venogram head  No evidence of dural venous sinus thrombosis  All CNS imaging personally reviewed; I agree with the above interpretations  Thyroid US  1. Approximately 2.2 cm TI-RADS category 4 nodule in the right mid gland meets criteria to consider fine-needle aspiration biopsy. Given relatively advanced age, imaging surveillance could also be considered. 2. Other small visualized thyroid nodule/pseudo nodules do not meet criteria to warrant further evaluation.  Stroke Labs     Component Value Date/Time   CHOL 148 07/02/2022 0508   TRIG 86 07/02/2022 0508   HDL 60 07/02/2022 0508   CHOLHDL 2.5 07/02/2022 0508   VLDL 17 07/02/2022 0508   LDLCALC  71 07/02/2022 0508    A1c - pending  TTE - no intracardiac clot or other abnl to explain infarct  EEG: 6-7 Hz BG (mild diffuse  slowing), no epileptiform abnl   A/P: 89 yo presented with acute dizziness and found to have an acute R PCA infarct. She also has a poorly reproducible L homonymous hemianopsia on exam. Stroke workup is now completed. Etiology severe intracranial stenosis. Other problems addressed this hospitalization include thyroid nodule and implications of chronic R IJ thrombosis, see below. She lives independently but does require some assistance from family and friends who come over. Routine EEG ordered for mild confusion and showed mild diffuse slowing with no epileptiform abnl. Suspect baseline mild cognitive impairment in combination with delirium.  # Acute R PCA infarct, etiology severe intracranial stenosis - ASA '81mg'$  daily + plavix '75mg'$  daily x90 days f/b ASA '81mg'$  daily monotherapy after that (90 days DAPT not 21 2/2 severe intracranial stenosis) - Start atorvastatin '20mg'$  daily - F/u A1c - TTE - Neurochecks q 4 hrs - Patient is outside the window for permissive HTN. Normotension should be gradually achieved over 3-5 days. Avoid hypotension. 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  Su Monks, MD [facility] [phone]  If 7pm- 7am, please page neurology on call as listed in [place].

## 2022-07-02 NOTE — Evaluation (Signed)
Speech Language Pathology Evaluation Patient Details Name: LEGACEE CREEKMORE MRN: [id number] DOB: 1927/12/08 Today's Date: 07/02/2022 Time: [id number] SLP Time Calculation (min) (ACUTE ONLY): 30 min  Problem List:  Patient Active Problem List   Diagnosis Date Noted   Stroke ([place]) 07/01/2022   HTN (hypertension) 07/01/2022   Depression 07/01/2022   Fall at home, initial encounter 07/01/2022   Thyroid nodule 07/01/2022   Benign essential hypertension 04/10/2021   Diverticulosis of small intestine 04/10/2021   Osteoporosis 04/10/2021   Restless legs syndrome (RLS) 04/10/2021   Osteoarthritis 04/10/2021   Gait abnormality 04/10/2021   Past Medical History:  Past Medical History:  Diagnosis Date   Depression    HTN (hypertension)    Macular degeneration    Past Surgical History:  Past Surgical History:  Procedure Laterality Date   BREAST CYST ASPIRATION Right 2006   benign   BREAST EXCISIONAL BIOPSY Right 1995   benign   HPI:  Per H&P, pt " is a 89 y.o. female with medical history significant of HTN, macular degeneration, who presents with dizziness, left leg weakness, decreased sensation in both hands hands, numbness in both feet.       Per patient and her daughter at the bedside, patient started having some nausea 3 days ago, no vomiting, diarrhea or abdominal pain.  Yesterday morning, patient started having dizziness and lightheadedness, developed weakness in left leg, then developed decreased sensation in both hands and feet.  Both feet are numb.  Patient fell yesterday without significant injury.  No loss of consciousness.  Patient does not have chest pain, cough, shortness breath.  No symptoms of UTI."   Assessment / Plan / Recommendation Clinical Impression   Pt seen today for cognitive-linguistic evaluation. Pt sitting upright in bed w/ family friend and pt's pastor present. Pt alert, cooperative, and pleasant during eval. Confusion evident c/b stating she was in her "house"  when asked. Pt left sitting up in bed w/ call button in reach and bed alarm set.  Family friend, Thayer Headings, reports pt lives at home w/ intermittent assistance from family and friends for company and ADLs. She reports pt is "fairly independent" including making her bed and sometimes cooking. Pt does not drive. Pt's granddaughter visits her daily and assists w/ finances and medications.  OM exam given and lingual/labial strength/ROM/symmetry WFL. Pt followed one-step directions to participate in exam.  Cognitive-linguistic exam given via informal means and administration of [employer] Mental Status (SLUMS) exam. Per assessment, pt appears to present w/ moderate cognitive impairment. Pt's speech and voice appear WFL.  Pt readily engaged in conversation w/ SLP regarding her family, church, and assistance at home. Noted pt's speech was fluent, intelligible, and w/ no apparent deficits in pragmatic language. Pt recalled her granddaughters name and place of living. When asked where she was, pt replied "my house" and given cues to reorient to place. Per SLUMS exam, pt oriented to weekday, year, and state. She exhibited difficulties in memory recall w/ delay, mental calculation, divergent naming, and visual-spatial skills. As pt drew clock w/ hand markers, noted deficits in planning, organization, and self-monitoring. Pt appeared intermittently aware of deficits, expressing "if I had time to think I would be able to do it."  Pt appropriately followed several one-step directions after administration of SLUMS. However, she demonstrated difficulty following two-step directions (I.e. point to the comb then the pen). Noted pt expressed difficulty coordinating movement to point to an item.  Recommend ST therapy to address cognitive deficits (following  multi-step directions, memory, organization, etc) and monitor progress during admission. RN updated.    SLP Assessment  SLP Recommendation/Assessment: Patient  needs continued Speech [facility] SLP Visit Diagnosis: Cognitive communication deficit (R41.841)    Recommendations for follow up therapy are one component of a multi-disciplinary discharge planning process, led by the attending physician.  Recommendations may be updated based on patient status, additional functional criteria and insurance authorization.    Follow Up Recommendations  Skilled nursing-short term rehab (<3 hours/day)    Assistance Recommended at Discharge  Frequent or constant Supervision/Assistance  Functional Status Assessment Patient has had a recent decline in their functional status and/or demonstrates limited ability to make significant improvements in function in a reasonable and predictable amount of time  Frequency and Duration min 2x/week  2 weeks      SLP Evaluation Cognition  Overall Cognitive Status: Impaired/Different from baseline Arousal/Alertness: Awake/alert Orientation Level: Oriented to person;Oriented to time;Oriented to situation;Disoriented to place Year: 2024 Day of Week: Correct Attention: Focused Focused Attention: Impaired Focused Attention Impairment: Functional complex;Verbal complex Memory: Impaired Memory Impairment: Decreased recall of new information Awareness: Appears intact Safety/Judgment: Appears intact       Comprehension  Auditory Comprehension Overall Auditory Comprehension: Impaired Yes/No Questions: Within Functional Limits Commands: Impaired One Step Basic Commands: 75-100% accurate Two Step Basic Commands: 0-24% accurate Conversation: Simple Interfering Components: Visual impairments EffectiveTechniques: Repetition;Extra processing time Visual Recognition/Discrimination Discrimination: Not tested Reading Comprehension Reading Status: Not tested    Expression Expression Primary Mode of Expression: Verbal Verbal Expression Overall Verbal Expression: Appears within functional limits for tasks  assessed Initiation: No impairment Level of Generative/Spontaneous Verbalization: Conversation;Word;Sentence;Phrase Repetition:  (NT) Naming: Not tested Pragmatics: No impairment Non-Verbal Means of Communication: Not applicable Written Expression Dominant Hand: Right Written Expression: Not tested   Oral / Motor  Oral Motor/Sensory Function Overall Oral Motor/Sensory Function: Within functional limits Motor Speech Overall Motor Speech: Appears within functional limits for tasks assessed Respiration: Within functional limits Phonation: Normal Resonance: Within functional limits Articulation: Within functional limitis Intelligibility: Intelligible Motor Planning: Witnin functional limits Motor Speech Errors: Not applicable           Randall Hiss Graduate Clinician [place], Speech Pathology   Randall Hiss 07/02/2022, 1:58 PM

## 2022-07-02 NOTE — Evaluation (Addendum)
Physical Therapy Evaluation Patient Details Name: [name] [name] MRN: [id number] DOB: [birth year] Today's Date: 07/02/2022  History of Present Illness  Pt is a 89 y/o F admitted on 07/01/22 after presenting with c/o dizziness, LLE weakness, & decreased sensation in BUE, numbness in BLE feet. Pt endorsed 1 fall prior to admission. MRI reveals acute R PCA infarct. PMH: HTN, macular degeneration, depression  Clinical Impression  Pt seen for PT evaluation with friend present in room. Pt is AxOx4 but appears to have some cognitive deficits, but also question visual deficits impacting functional mobility during session. (Pt with hx of macular degeneration but friend reports pt's L eye is worse than R eye.) Prior to admission pt was ambulatory in her home with a rollator, still cooking & doing laundry, with someone assisting with full bathing or else pt sponge bathed. On this date, pt requires min assist to complete supine>sit, as pt attempting to push lower part of bed down as if it were a recliner. Pt requires min assist for transfers & gait with RW. Pt demonstrates LOB when attempting STS from EOB & decreased ability to safely maneuver RW. Pt currently presents at increased fall risk. Recommend ongoing PT services to address balance, gait with LRAD, & safety awareness prior to return home.     Recommendations for follow up therapy are one component of a multi-disciplinary discharge planning process, led by the attending physician.  Recommendations may be updated based on patient status, additional functional criteria and insurance authorization.  Follow Up Recommendations Skilled nursing-short term rehab (<3 hours/day) Can patient physically be transported by private vehicle: Yes    Assistance Recommended at Discharge Frequent or constant Supervision/Assistance  Patient can return home with the following  A little help with walking and/or transfers;A little help with bathing/dressing/bathroom;Assistance  with cooking/housework;Help with stairs or ramp for entrance;Assist for transportation;Direct supervision/assist for medications management;Direct supervision/assist for financial management    Equipment Recommendations None recommended by PT  Recommendations for Other Services       Functional Status Assessment Patient has had a recent decline in their functional status and demonstrates the ability to make significant improvements in function in a reasonable and predictable amount of time.     Precautions / Restrictions Precautions Precautions: Fall Precaution Comments: decreased vision Restrictions Weight Bearing Restrictions: No      Mobility  Bed Mobility Overal bed mobility: Needs Assistance Bed Mobility: Supine to Sit     Supine to sit: Min assist     General bed mobility comments: Pt requires multimodal cuing to move BLE to L to EOB to place feet on floor as pt attempts to push foot of bed down as if it were a recliner.    Transfers Overall transfer level: Needs assistance Equipment used: Rolling walker (2 wheels) Transfers: Sit to/from Stand Sit to Stand: Min assist           General transfer comment: STS from EOB, 1 posterior LOB onto EOB.    Ambulation/Gait   Gait Distance (Feet): 110 Feet Assistive device: Rolling walker (2 wheels) Gait Pattern/deviations: Decreased step length - right, Decreased step length - left, Decreased stride length, Decreased dorsiflexion - right, Decreased dorsiflexion - left Gait velocity: decreased     General Gait Details: Pt pushes RW out in front of her, decreased awareness re: need to ambulate within base of AD. Requires assistance for management/manuevering RW in small spaces.  Stairs            Emergency planning/management officer  Modified Rankin (Stroke Patients Only)       Balance Overall balance assessment: Needs assistance Sitting-balance support: Feet supported Sitting balance-Leahy Scale: Fair     Standing  balance support: During functional activity, Bilateral upper extremity supported, Reliant on assistive device for balance Standing balance-Leahy Scale: Poor                               Pertinent Vitals/Pain Pain Assessment Pain Assessment: No/denies pain    Home Living Family/patient expects to be discharged to:: Private residence Living Arrangements: Alone Available Help at Discharge: Family;Friend(s);Neighbor;Available PRN/intermittently Type of Home: House Home Access: Ramped entrance       Home Layout: One level Home Equipment: Rollator (4 wheels);BSC/3in1;Cane - single point      Prior Function               Mobility Comments: Pt reports she ambulates in the home with rollator, cane outside of the home. Denies falls. Neighbors assist with getting mail & checking on her daily, friend assists PRN, granddaughter checks in. ADLs Comments: Per chart, pt sponge bathes primarily but someone does assist her with full bathing 2x/week. Does not drive.     Hand Dominance   Dominant Hand: Right    Extremity/Trunk Assessment   Upper Extremity Assessment Upper Extremity Assessment: Overall WFL for tasks assessed    Lower Extremity Assessment Lower Extremity Assessment: Generalized weakness    Cervical / Trunk Assessment Cervical / Trunk Assessment: Kyphotic  Communication   Communication: No difficulties  Cognition Arousal/Alertness: Awake/alert Behavior During Therapy: WFL for tasks assessed/performed Overall Cognitive Status: Difficult to assess                                 General Comments: Pt is AxOx4 but presents with difficulty getting OOB, decreased ability to manage/manuever RW in small spaces but question if this is 2/2 visual or cognitive deficits.        General Comments      Exercises     Assessment/Plan    PT Assessment Patient needs continued PT services  PT Problem List Decreased strength;Decreased range of  motion;Decreased activity tolerance;Decreased balance;Decreased mobility;Decreased safety awareness;Decreased knowledge of use of DME;Decreased cognition       PT Treatment Interventions DME instruction;Therapeutic exercise;Gait training;Balance training;Functional mobility training;Cognitive remediation;Therapeutic activities;Patient/family education;Neuromuscular re-education    PT Goals (Current goals can be found in the Care Plan section)  Acute Rehab PT Goals Patient Stated Goal: none stated PT Goal Formulation: With patient Time For Goal Achievement: 07/16/22 Potential to Achieve Goals: Good    Frequency 7X/week     Co-evaluation               AM-PAC PT "6 Clicks" Mobility  Outcome Measure Help needed turning from your back to your side while in a flat bed without using bedrails?: A Little Help needed moving from lying on your back to sitting on the side of a flat bed without using bedrails?: A Little Help needed moving to and from a bed to a chair (including a wheelchair)?: A Little Help needed standing up from a chair using your arms (e.g., wheelchair or bedside chair)?: A Little Help needed to walk in hospital room?: A Little Help needed climbing 3-5 steps with a railing? : A Lot 6 Click Score: 17    End of Session   Activity Tolerance:  Patient tolerated treatment well Patient left: in chair;with call bell/phone within reach;with chair alarm set;with family/visitor present Nurse Communication: Mobility status PT Visit Diagnosis: Muscle weakness (generalized) (M62.81);Other abnormalities of gait and mobility (R26.89);Unsteadiness on feet (R26.81)    Time: [id number] PT Time Calculation (min) (ACUTE ONLY): 17 min   Charges:   PT Evaluation $PT Eval Moderate Complexity: 1 Mod PT Treatments $Therapeutic Activity: 8-22 mins        Lavone Nian, PT, DPT 07/02/22, 10:24 AM   Waunita Schooner 07/02/2022, 10:22 AM

## 2022-07-02 NOTE — Progress Notes (Signed)
       CROSS COVER NOTE  NAME: [name] [name] MRN: [id number] DOB : 04-Aug-1927 ATTENDING PHYSICIAN: Ivor Costa, MD    Date of Service   07/02/2022   HPI/Events of Note   Message received from RN  "Patient admit for CVA, confused and trying to get out of bed, could we get an order a Air cabin crew, NIH has improved. It appears she has sundowners"  qtc 454 on last EKG.  Interventions   Assessment/Plan: Haldol x1 Tele sitter Delirium precautions - Minimize/avoid deliriogenic meds including: anticholinergic, opiates, benzodiazepines  - Maintain hydration, oxygenation, nutrition - Limit use of restraints and catheters  - Normalize sleep patterns by minimizing nighttime noise, light and interruptions by ensuring sleep apnea treatment is provided overnight if applicable, clustering care, opening blinds during the day  - Reorient the patient frequently, provide easily visible clock and calendar - Provide sensory aids like glasses, hearing aids - Encourage ambulation, regular activities and visitors to maintain cognitive stimulation   -Patient would benefit from having family members at bedside to reinforce his orientation      To reach the provider On-Call:   7AM- 7PM see care teams to locate the attending and reach out to them via [email or web address] 7PM-7AM contact night-coverage If you still have difficulty reaching the appropriate provider, please page the [facility] (Director on Call) for [facility] on amion for assistance  This document was prepared using Systems analyst and may include unintentional dictation errors.  Neomia Glass DNP, MBA, FNP-BC, PMHNP-BC Nurse Practitioner [facility] Pager [phone]

## 2022-07-02 NOTE — Progress Notes (Signed)
Patient sent to [facility] in stable condition via bed.

## 2022-07-02 NOTE — Progress Notes (Signed)
PROGRESS NOTE    [name] [name]  [id number] DOB: 24-Apr-1927 DOA: 07/01/2022 PCP: Derinda Late, MD   Brief Narrative:  This 89 years old female with PMH significant for hypertension, macular degeneration presented to the ED with complaints of dizziness, left leg weakness, decreased sensation in both hands, numbness in both feet.  Daughter reports that patient started having nausea 3 days ago,  She started complaining of dizziness, lightheadedness, weakness and has developed numbness in both hands yesterday. She subsequently fell. She denies any significant injury.  Denies any loss of consciousness.  She was hypertensive in the ED. Chest x-ray showed right basilar atelectasis. CT head and C-spine showed acute to subacute right PCA infarct, No acute cervical spine fracture.  Neurology was consulted,  Patient is admitted for stroke workup.  Assessment & Plan:   Principal Problem:   Stroke [facility]) Active Problems:   HTN (hypertension)   Depression   Fall at home, initial encounter   Thyroid nodule  Acute CVA: Patient presented with dizziness, left leg weakness, bilateral hand numbness. CT head showed acute to subacute right PCA infarct. Neurology is consulted.  MRI brain confirms acute right PCA infarct Obtain CTA neck and head and CT venogram. Continue aspirin and Plavix Continue Lipitor 40 mg daily Patient is out of window for permissive hypertension. Obtain 2D echocardiogram. Speech and swallow eval. PT and OT evaluation  Essential hypertension: Patient presented with BP 260/98 >172/84 Patient used to be on Zebeta which has been on hold recently due to low blood pressure. Restart Zebeta. Continue hydralazine IV as needed.  Major depression: Continue Remeron.  S/P fall at home: Continue Fall precautions Continue PT and OT eval.   Thyroid nodule: Incidental finding Continue thyroid ultrasound. Obtain Thyroid profile.   DVT prophylaxis:  Lovenox. Code  Status:DNR Family Communication: Friend at bed side. Disposition Plan:   Admitted for Stroke workup.  Consultants:  Neurology  Procedures: CT Head, MRI , Echo  Antimicrobials: Anti-infectives (From admission, onward)    None       Subjective: Patient was seen and examined at bedside.  Overnight events noted. Patient reports doing better.  She was having breakfast. Friend at bed side. She reports feeling slightly better,  still has numbness and weakness.  Objective: Vitals:   07/01/22 1932 07/01/22 2339 07/02/22 0538 07/02/22 0818  BP: (!) 162/76 (!) 186/96 (!) 180/88 (!) 171/82  Pulse: 86 82 63 80  Resp: '18 16 17 18  '$ Temp: 98 F (36.7 C) (!) 97.5 F (36.4 C) 97.6 F (36.4 C)   TempSrc: Oral Oral Oral   SpO2: 97% 98% 98% 94%  Weight:      Height:        Intake/Output Summary (Last 24 hours) at 07/02/2022 1142 Last data filed at 07/02/2022 0800 Gross per 24 hour  Intake 60 ml  Output --  Net 60 ml   Filed Weights   07/01/22 1112  Weight: 52.2 kg    Examination:  General exam: Appears calm and comfortable, not in any distress, deconditioned Respiratory system: Clear to auscultation. Respiratory effort normal.  RR 16 Cardiovascular system: S1 & S2 heard, regular rate and rhythm, no murmur. Gastrointestinal system: Abdomen is soft, non tender, non distended, BS+ Central nervous system: Alert and oriented x 2. No focal neurological deficits. Extremities: No edema, no cyanosis, no clubbing. Skin: No rashes, lesions or ulcers Psychiatry: Judgement and insight appear normal. Mood & affect appropriate.     Data Reviewed: I have personally reviewed following  labs and imaging studies  CBC: Recent Labs  Lab 07/01/22 1138  WBC 4.2  NEUTROABS 2.5  HGB 13.5  HCT 41.5  MCV 93.0  PLT [id number]*   Basic Metabolic Panel: Recent Labs  Lab 07/01/22 1138  NA 140  K 3.7  CL 105  CO2 24  GLUCOSE 100*  BUN 13  CREATININE 0.74  CALCIUM 9.2  MG 2.2    GFR: Estimated Creatinine Clearance: 34.7 mL/min (by C-G formula based on SCr of 0.74 mg/dL). Liver Function Tests: Recent Labs  Lab 07/01/22 1138  AST 24  ALT 14  ALKPHOS 71  BILITOT 0.7  PROT 6.6  ALBUMIN 4.1   No results for input(s): "LIPASE", "AMYLASE" in the last 168 hours. No results for input(s): "AMMONIA" in the last 168 hours. Coagulation Profile: No results for input(s): "INR", "PROTIME" in the last 168 hours. Cardiac Enzymes: No results for input(s): "CKTOTAL", "CKMB", "CKMBINDEX", "TROPONINI" in the last 168 hours. BNP (last 3 results) No results for input(s): "PROBNP" in the last 8760 hours. HbA1C: No results for input(s): "HGBA1C" in the last 72 hours. CBG: Recent Labs  Lab 07/01/22 2055 07/02/22 0753  GLUCAP 156* 102*   Lipid Profile: Recent Labs    07/02/22 0508  CHOL 148  HDL 60  LDLCALC 71  TRIG 86  CHOLHDL 2.5   Thyroid Function Tests: Recent Labs    07/01/22 1444 07/01/22 1535  TSH 1.619  --   FREET4  --  0.99   Anemia Panel: No results for input(s): "VITAMINB12", "FOLATE", "FERRITIN", "TIBC", "IRON", "RETICCTPCT" in the last 72 hours. Sepsis Labs: Recent Labs  Lab 07/01/22 1138 07/01/22 1451  LATICACIDVEN 1.5 1.4    Recent Results (from the past 240 hour(s))  Resp panel by RT-PCR (RSV, Flu A&B, Covid) Anterior Nasal Swab     Status: None   Collection Time: 07/01/22 11:38 AM   Specimen: Anterior Nasal Swab  Result Value Ref Range Status   SARS Coronavirus 2 by RT PCR NEGATIVE NEGATIVE Final    Comment: (NOTE) SARS-CoV-2 target nucleic acids are NOT DETECTED.  The SARS-CoV-2 RNA is generally detectable in upper respiratory specimens during the acute phase of infection. The lowest concentration of SARS-CoV-2 viral copies this assay can detect is 138 copies/mL. A negative result does not preclude SARS-Cov-2 infection and should not be used as the sole basis for treatment or other patient management decisions. A negative  result may occur with  improper specimen collection/handling, submission of specimen other than nasopharyngeal swab, presence of viral mutation(s) within the areas targeted by this assay, and inadequate number of viral copies(<138 copies/mL). A negative result must be combined with clinical observations, patient history, and epidemiological information. The expected result is Negative.  Fact Sheet for Patients:  EntrepreneurPulse.com.au  Fact Sheet for Healthcare Providers:  IncredibleEmployment.be  This test is no t yet approved or cleared by the [place] FDA and  has been authorized for detection and/or diagnosis of SARS-CoV-2 by FDA under an Emergency Use Authorization (EUA). This EUA will remain  in effect (meaning this test can be used) for the duration of the COVID-19 declaration under Section 564(b)(1) of the Act, 21 U.S.C.section 360bbb-3(b)(1), unless the authorization is terminated  or revoked sooner.       Influenza A by PCR NEGATIVE NEGATIVE Final   Influenza B by PCR NEGATIVE NEGATIVE Final    Comment: (NOTE) The Xpert Xpress SARS-CoV-2/FLU/RSV plus assay is intended as an aid in the diagnosis of influenza from Nasopharyngeal swab  specimens and should not be used as a sole basis for treatment. Nasal washings and aspirates are unacceptable for Xpert Xpress SARS-CoV-2/FLU/RSV testing.  Fact Sheet for Patients: EntrepreneurPulse.com.au  Fact Sheet for Healthcare Providers: IncredibleEmployment.be  This test is not yet approved or cleared by the [place] FDA and has been authorized for detection and/or diagnosis of SARS-CoV-2 by FDA under an Emergency Use Authorization (EUA). This EUA will remain in effect (meaning this test can be used) for the duration of the COVID-19 declaration under Section 564(b)(1) of the Act, 21 U.S.C. section 360bbb-3(b)(1), unless the authorization is  terminated or revoked.     Resp Syncytial Virus by PCR NEGATIVE NEGATIVE Final    Comment: (NOTE) Fact Sheet for Patients: EntrepreneurPulse.com.au  Fact Sheet for Healthcare Providers: IncredibleEmployment.be  This test is not yet approved or cleared by the [place] FDA and has been authorized for detection and/or diagnosis of SARS-CoV-2 by FDA under an Emergency Use Authorization (EUA). This EUA will remain in effect (meaning this test can be used) for the duration of the COVID-19 declaration under Section 564(b)(1) of the Act, 21 U.S.C. section 360bbb-3(b)(1), unless the authorization is terminated or revoked.  Performed at [facility], [address] [place], [place] [address]     Radiology Studies: US THYROID  Result Date: 07/02/2022 CLINICAL DATA:  Incidental on CT. EXAM: THYROID ULTRASOUND TECHNIQUE: Ultrasound examination of the thyroid gland and adjacent soft tissues was performed. COMPARISON:  None Available. FINDINGS: Parenchymal Echotexture: Moderately heterogenous Isthmus: 0.3 cm Right lobe: 4.6 x 3.0 x 1.4 cm Left lobe: 3.2 x 1.5 x 1.1 cm _________________________________________________________ Estimated total number of nodules >/= 1 cm: 3 Number of spongiform nodules >/=  2 cm not described below (TR1): 0 Number of mixed cystic and solid nodules >/= 1.5 cm not described below (TR2): 0 _________________________________________________________ Nodule # 1: Location: Right; Mid Maximum size: 2.2 cm; Other 2 dimensions: 2.1 x 1.6 cm Composition: solid/almost completely solid (2) Echogenicity: isoechoic (1) Shape: taller-than-wide (3) Margins: ill-defined (0) Echogenic foci: none (0) ACR TI-RADS total points: 6. ACR TI-RADS risk category: TR4 (4-6 points). ACR TI-RADS recommendations: **Given size (>/= 1.5 cm) and appearance, fine needle aspiration of this moderately suspicious nodule should be considered based on TI-RADS  criteria. _________________________________________________________ Nodule # 2: Ill-defined grossly 1.4 cm isoechoic solid nodule in the right lower gland. This either represents a pseudo nodule, or a TI-RADS category 3 nodule. In either case, it does not meet criteria to warrant further imaging follow-up. Nodule # 3: 1 cm spongiform nodule in the left mid gland. TI-RADS category 2. This nodule does NOT meet TI-RADS criteria for biopsy or dedicated follow-up. IMPRESSION: 1. Approximately 2.2 cm TI-RADS category 4 nodule in the right mid gland meets criteria to consider fine-needle aspiration biopsy. Given relatively advanced age, imaging surveillance could also be considered. 2. Other small visualized thyroid nodule/pseudo nodules do not meet criteria to warrant further evaluation. The above is in keeping with the ACR TI-RADS recommendations - J Am Coll Radiol 2017;14:587-595. Electronically Signed   By: Jacqulynn Cadet M.D.   On: 07/02/2022 05:55   CT VENOGRAM HEAD  Result Date: 07/01/2022 CLINICAL DATA:  Dural venous sinus thrombosis suspected. EXAM: CT VENOGRAM HEAD TECHNIQUE: Venographic phase images of the brain were obtained following the administration of intravenous contrast. Multiplanar reformats and maximum intensity projections were generated. RADIATION DOSE REDUCTION: This exam was performed according to the departmental dose-optimization program which includes automated exposure control, adjustment of the mA and/or  kV according to patient size and/or use of iterative reconstruction technique. CONTRAST:  46m OMNIPAQUE IOHEXOL 350 MG/ML SOLN COMPARISON:  None Available. FINDINGS: The superior sagittal sinus, internal cerebral veins, vein of Galen, straight sinus, transverse sinuses, sigmoid sinuses, and jugular bulbs are patent without evidence of thrombus or significant stenosis. IMPRESSION: No evidence of dural venous sinus thrombosis. Electronically Signed   By: ALogan BoresM.D.   On:  07/01/2022 18:45   CT ANGIO HEAD NECK W WO CM  Result Date: 07/01/2022 CLINICAL DATA:  Neuro deficit, acute, stroke suspected. Acute right PCA infarct. EXAM: CT ANGIOGRAPHY HEAD AND NECK TECHNIQUE: Multidetector CT imaging of the head and neck was performed using the standard protocol during bolus administration of intravenous contrast. Multiplanar CT image reconstructions and MIPs were obtained to evaluate the vascular anatomy. Carotid stenosis measurements (when applicable) are obtained utilizing NASCET criteria, using the distal internal carotid diameter as the denominator. RADIATION DOSE REDUCTION: This exam was performed according to the departmental dose-optimization program which includes automated exposure control, adjustment of the mA and/or kV according to patient size and/or use of iterative reconstruction technique. CONTRAST:  768mOMNIPAQUE IOHEXOL 350 MG/ML SOLN COMPARISON:  None Available. FINDINGS: CTA NECK FINDINGS Aortic arch: Standard 3 vessel aortic arch with mild atherosclerotic plaque. Widely patent brachiocephalic and subclavian arteries. Right carotid system: Patent with a small amount of calcified plaque at the carotid bifurcation. No evidence of a significant stenosis or dissection. Left carotid system: Patent with a small amount of calcified plaque at the carotid bifurcation. No evidence of a significant stenosis or dissection. Vertebral arteries: Patent without evidence of stenosis, dissection, or significant atherosclerosis. Mildly dominant left vertebral artery. Skeleton: Widespread advanced facet arthrosis and disc degeneration in the cervical spine with mild multilevel degenerative listhesis. Other neck: Multiple right thyroid nodules measuring up to approximately 2 cm. No evidence of cervical lymphadenopathy. Upper chest: Biapical pleuroparenchymal lung scarring. Review of the MIP images confirms the above findings CTA HEAD FINDINGS Anterior circulation: The internal carotid  arteries are patent from skull base to carotid termini with mild atherosclerosis bilaterally not resulting in significant stenosis. ACAs and MCAs are patent without evidence of a significant A1 or M1 stenosis. There is moderate MCA branch vessel atherosclerosis bilaterally, and there are severe left A2 and bilateral A3 stenoses. No aneurysm identified. Posterior circulation: Intracranial vertebral arteries are patent to the basilar with mild atheromatous irregularity but no significant stenosis. Patent PICA, AICA, and SCA origins are seen bilaterally. The basilar artery is widely patent. Posterior communicating arteries are diminutive or absent. The PCAs are patent with a severe, near occlusive stenosis of the distal right P2 segment and with were distal branch vessel irregularity noted bilaterally. No aneurysm is identified. Venous sinuses: More fully evaluated on the separate venogram. Anatomic variants: None. Review of the MIP images confirms the above findings IMPRESSION: 1. No emergent large vessel occlusion. 2. Intracranial atherosclerosis including severe distal right P2 and left A2 stenoses. 3. Widely patent cervical carotid and vertebral arteries. 4. 2 cm right thyroid nodule. Recommend non-emergent thyroid ultrasound if clinically warranted given patient age. Reference: J Am Coll Radiol. 2015 Feb;12(2): 143-50 5.  Aortic Atherosclerosis (ICD10-I70.0). Electronically Signed   By: AlLogan Bores.D.   On: 07/01/2022 18:44   MR BRAIN WO CONTRAST  Result Date: 07/01/2022 CLINICAL DATA:  Neuro deficit, acute, stroke suspected. Right PCA infarct on CT. EXAM: MRI HEAD WITHOUT CONTRAST TECHNIQUE: Multiplanar, multiecho pulse sequences of the brain and surrounding structures were obtained  without intravenous contrast. COMPARISON:  Head CT 07/01/2022 FINDINGS: Brain: There is an acute right PCA infarct involving portions of the occipital lobe, posterior temporal lobe including hippocampal tail, splenium of the  corpus callosum, and lateral right thalamus. A single focus of chronic microhemorrhage is noted in the right parietal periventricular white matter. T2 hyperintensities elsewhere in the cerebral white matter bilaterally are nonspecific but compatible with mild chronic small vessel ischemic disease. There is generalized cerebral atrophy. No mass, midline shift, or extra-axial fluid collection is identified. Vascular: Major intracranial vascular flow voids are preserved. Skull and upper cervical spine: Unremarkable bone marrow signal. Sinuses/Orbits: Bilateral cataract extraction. Paranasal sinuses and mastoid air cells are clear. Other: None. IMPRESSION: 1. Acute right PCA infarct. 2. Mild chronic small vessel ischemic disease. Electronically Signed   By: Logan Bores M.D.   On: 07/01/2022 18:04   CT Head Wo Contrast  Result Date: 07/01/2022 CLINICAL DATA:  Head trauma, minor (Age >= 65y); Neck trauma (Age >= 65y). Fall yesterday. EXAM: CT HEAD WITHOUT CONTRAST CT CERVICAL SPINE WITHOUT CONTRAST TECHNIQUE: Multidetector CT imaging of the head and cervical spine was performed following the standard protocol without intravenous contrast. Multiplanar CT image reconstructions of the cervical spine were also generated. RADIATION DOSE REDUCTION: This exam was performed according to the departmental dose-optimization program which includes automated exposure control, adjustment of the mA and/or kV according to patient size and/or use of iterative reconstruction technique. COMPARISON:  None Available. FINDINGS: CT HEAD FINDINGS Brain: A 3 cm region of confluent hypodensity involving cortex and white matter in the right occipital lobe is consistent with an acute to subacute PCA infarct. No intracranial mass effect, acute intracranial hemorrhage, or extra-axial fluid collection is identified. Hypodensities elsewhere in the cerebral white matter bilaterally are nonspecific but compatible with mild chronic small vessel  ischemic disease. Cerebral atrophy is mild for age. Vascular: Calcified atherosclerosis at the skull base. Small calcification at the basilar tip. Skull: No acute fracture or suspicious osseous lesion. Sinuses/Orbits: Visualized paranasal sinuses and mastoid air cells are clear. Bilateral cataract extraction. Other: None. CT CERVICAL SPINE FINDINGS Alignment: Reversal of the normal cervical lordosis. Grade 1 anterolisthesis of C3 on C4, C4 on C5, C5 on C6, C7 on T1, T1 on T2, and T2 on T3. Mild right convex curvature of the cervical spine. Skull base and vertebrae: No acute fracture or suspicious osseous lesion. Asymmetrically advanced left-sided C1-2 arthropathy. Interbody and facet ankylosis at C6-7. Bridging anterior vertebral osteophytes at C7-T1 and T1-2. Soft tissues and spinal canal: No prevertebral fluid or swelling. No visible canal hematoma. Disc levels: Widespread disc degeneration, most advanced at C5-6. Widespread severe facet arthrosis throughout the cervical and included upper thoracic spine. Advanced neural foraminal stenosis bilaterally at C3-4 and C4-5 and on the left at C5-6. Suspected moderate spinal stenosis at C3-4. Upper chest: Biapical pleuroparenchymal lung scarring. Other: Right thyroid nodules measuring up to 2 cm. IMPRESSION: 1. Acute to subacute right PCA infarct. 2. No acute cervical spine fracture. 3. Advanced cervical disc and facet degeneration. 4. 2 cm incidental right thyroid nodule. Recommend non-emergent thyroid ultrasound if clinically warranted given patient age. Reference: J Am Coll Radiol. 2015 Feb;12(2): 143-50 Electronically Signed   By: Logan Bores M.D.   On: 07/01/2022 12:11   CT Cervical Spine Wo Contrast  Result Date: 07/01/2022 CLINICAL DATA:  Head trauma, minor (Age >= 65y); Neck trauma (Age >= 65y). Fall yesterday. EXAM: CT HEAD WITHOUT CONTRAST CT CERVICAL SPINE WITHOUT CONTRAST TECHNIQUE: Multidetector CT  imaging of the head and cervical spine was performed  following the standard protocol without intravenous contrast. Multiplanar CT image reconstructions of the cervical spine were also generated. RADIATION DOSE REDUCTION: This exam was performed according to the departmental dose-optimization program which includes automated exposure control, adjustment of the mA and/or kV according to patient size and/or use of iterative reconstruction technique. COMPARISON:  None Available. FINDINGS: CT HEAD FINDINGS Brain: A 3 cm region of confluent hypodensity involving cortex and white matter in the right occipital lobe is consistent with an acute to subacute PCA infarct. No intracranial mass effect, acute intracranial hemorrhage, or extra-axial fluid collection is identified. Hypodensities elsewhere in the cerebral white matter bilaterally are nonspecific but compatible with mild chronic small vessel ischemic disease. Cerebral atrophy is mild for age. Vascular: Calcified atherosclerosis at the skull base. Small calcification at the basilar tip. Skull: No acute fracture or suspicious osseous lesion. Sinuses/Orbits: Visualized paranasal sinuses and mastoid air cells are clear. Bilateral cataract extraction. Other: None. CT CERVICAL SPINE FINDINGS Alignment: Reversal of the normal cervical lordosis. Grade 1 anterolisthesis of C3 on C4, C4 on C5, C5 on C6, C7 on T1, T1 on T2, and T2 on T3. Mild right convex curvature of the cervical spine. Skull base and vertebrae: No acute fracture or suspicious osseous lesion. Asymmetrically advanced left-sided C1-2 arthropathy. Interbody and facet ankylosis at C6-7. Bridging anterior vertebral osteophytes at C7-T1 and T1-2. Soft tissues and spinal canal: No prevertebral fluid or swelling. No visible canal hematoma. Disc levels: Widespread disc degeneration, most advanced at C5-6. Widespread severe facet arthrosis throughout the cervical and included upper thoracic spine. Advanced neural foraminal stenosis bilaterally at C3-4 and C4-5 and on the  left at C5-6. Suspected moderate spinal stenosis at C3-4. Upper chest: Biapical pleuroparenchymal lung scarring. Other: Right thyroid nodules measuring up to 2 cm. IMPRESSION: 1. Acute to subacute right PCA infarct. 2. No acute cervical spine fracture. 3. Advanced cervical disc and facet degeneration. 4. 2 cm incidental right thyroid nodule. Recommend non-emergent thyroid ultrasound if clinically warranted given patient age. Reference: J Am Coll Radiol. 2015 Feb;12(2): 143-50 Electronically Signed   By: Logan Bores M.D.   On: 07/01/2022 12:11   DG Chest Port 1 View  Result Date: 07/01/2022 CLINICAL DATA:  Weakness. EXAM: PORTABLE CHEST 1 VIEW COMPARISON:  09/25/2004 radiograph FINDINGS: The cardiomediastinal silhouette is unremarkable. Mild RIGHT basilar subsegmental atelectasis/scarring noted. There is no evidence of focal airspace disease, pulmonary edema, suspicious pulmonary nodule/mass, pleural effusion, or pneumothorax. No acute bony abnormalities are identified. IMPRESSION: Mild RIGHT basilar subsegmental atelectasis/scarring. No other acute abnormality. Electronically Signed   By: Margarette Canada M.D.   On: 07/01/2022 11:44    Scheduled Meds:  [START ON 07/03/2022] aspirin EC  81 mg Oral Daily   atorvastatin  40 mg Oral Daily   bisoprolol  5 mg Oral Daily   clopidogrel  75 mg Oral Daily   enoxaparin (LOVENOX) injection  40 mg Subcutaneous Q24H   mirtazapine  7.5 mg Oral QHS   multivitamin with minerals  1 tablet Oral Daily   Continuous Infusions:   LOS: 1 day    Time spent: 50 mins    Duard Brady, MD [facility]   If 7PM-7AM, please contact night-coverage

## 2022-07-02 NOTE — Progress Notes (Signed)
SLP Cancellation Note  Patient Details Name: [name] [name] MRN: [id number] DOB: 13-Mar-1928   Cancelled treatment:       Reason Eval/Treat Not Completed: Patient at procedure or test/unavailable  Cherrie Gauze, M.S., [facility] [phone] [place])  Quintella Baton 07/02/2022, 11:49 AM

## 2022-07-02 NOTE — Evaluation (Signed)
Occupational Therapy Evaluation Patient Details Name: [name] [name] MRN: [id number] DOB: Aug 24, 1927 Today's Date: 07/02/2022   History of Present Illness Pt is a 89 y/o F admitted on 07/01/22 after presenting with c/o dizziness, LLE weakness, & decreased sensation in BUE, numbness in BLE feet. Pt endorsed 1 fall prior to admission. MRI reveals acute R PCA infarct. PMH: HTN, macular degeneration, depression   Clinical Impression   Patient seen for OT evaluation, friend present. Pt presenting with decreased independence in self care, balance, functional mobility/transfers, endurance, and safety awareness. PTA pt lived alone, received assistance for ADLs/IADLs as needed, and was Mod I for functional mobility using a rollator or cane. Pt A&Ox4 this date. Pt currently functioning at [facility] for simulated toilet transfer and Min A for functional mobility at room level using RW. She required frequent VC for safe navigation of environment 2/2 low vision. Eval limited this date 2/2 transport arriving to take pt to procedure. Anticipate increased assistance required for standing ADL tasks. Pt will benefit from acute OT to increase overall independence in the areas of ADLs and functional mobility in order to safely discharge to next venue of care. OT recommends ongoing therapy upon discharge to maximize safety and independence with ADLs, decrease fall risk, decrease caregiver burden, and promote return to PLOF.     Recommendations for follow up therapy are one component of a multi-disciplinary discharge planning process, led by the attending physician.  Recommendations may be updated based on patient status, additional functional criteria and insurance authorization.   Follow Up Recommendations  Skilled nursing-short term rehab (<3 hours/day)     Assistance Recommended at Discharge Frequent or constant Supervision/Assistance  Patient can return home with the following A little help with walking and/or  transfers;A little help with bathing/dressing/bathroom;Assistance with cooking/housework;Assist for transportation;Help with stairs or ramp for entrance;Direct supervision/assist for financial management;Direct supervision/assist for medications management    Functional Status Assessment  Patient has had a recent decline in their functional status and demonstrates the ability to make significant improvements in function in a reasonable and predictable amount of time.  Equipment Recommendations  None recommended by OT    Recommendations for Other Services       Precautions / Restrictions Precautions Precautions: Fall Precaution Comments: low vision Restrictions Weight Bearing Restrictions: No      Mobility Bed Mobility Overal bed mobility: Needs Assistance Bed Mobility: Sit to Supine       Sit to supine: Min guard        Transfers Overall transfer level: Needs assistance Equipment used: Rolling walker (2 wheels) Transfers: Sit to/from Stand Sit to Stand: Min assist           General transfer comment: VC for safe technique      Balance Overall balance assessment: Needs assistance Sitting-balance support: Feet supported Sitting balance-Leahy Scale: Fair     Standing balance support: During functional activity, Bilateral upper extremity supported, Reliant on assistive device for balance Standing balance-Leahy Scale: Poor                             ADL either performed or assessed with clinical judgement   ADL Overall ADL's : Needs assistance/impaired                         Toilet Transfer: Minimal assistance;Rolling walker (2 wheels) Toilet Transfer Details (indicate cue type and reason): simulated  Functional mobility during ADLs: Minimal assistance;Rolling walker (2 wheels) (assist for RW management and Mod VC for safe navigation of environment 2/2 low vision) General ADL Comments: Eval cut short 2/2 transport arriving to  take pt to procedure. Anticipate increased assistance required for standing ADL tasks.     Vision Baseline Vision/History: 1 Wears glasses;6 Macular Degeneration Patient Visual Report: No change from baseline Additional Comments: Per pt, has macular degeneration at baseline and no new vision changes (L eye worse than R). Per neurologist note "possible left-sided hemianopia", however, difficult to assess 2/2 baseline macular degeneration.     Perception     Praxis      Pertinent Vitals/Pain Pain Assessment Pain Assessment: No/denies pain     Hand Dominance Right   Extremity/Trunk Assessment Upper Extremity Assessment Upper Extremity Assessment: Overall WFL for tasks assessed   Lower Extremity Assessment Lower Extremity Assessment: Generalized weakness   Cervical / Trunk Assessment Cervical / Trunk Assessment: Kyphotic   Communication Communication Communication: No difficulties   Cognition Arousal/Alertness: Awake/alert Behavior During Therapy: WFL for tasks assessed/performed Overall Cognitive Status: Difficult to assess                                 General Comments: Pt is A&Ox4, but presents with some "forgetfulness" during session with friend correcting responses to PLOF/home set up questions.     General Comments       Exercises Other Exercises Other Exercises: OT provided education re: role of OT, OT POC, post acute recs, sitting up for all meals, EOB/OOB mobility with assistance, home/fall safety.     Shoulder Instructions      Home Living Family/patient expects to be discharged to:: Private residence Living Arrangements: Alone Available Help at Discharge: Family;Friend(s);Neighbor;Available PRN/intermittently Type of Home: House Home Access: Ramped entrance     Home Layout: One level     Bathroom Shower/Tub: Occupational psychologist: Standard     Home Equipment: Rollator (4 wheels);BSC/3in1;Cane - single point;Grab bars -  toilet;Grab bars - tub/shower          Prior Functioning/Environment Prior Level of Function : Needs assist       Physical Assist : ADLs (physical)   ADLs (physical): IADLs;Bathing Mobility Comments: Pt reports she ambulates in the home with rollator, cane outside of the home. Denies falls. Neighbors assist with getting mail & checking on her daily, friend assists PRN, granddaughter checks in. ADLs Comments: Per chart, pt sponge bathes primarily but someone does assist her with full bathing 2x/week. IND light meal prep. Does not drive, family/friend provides transportation.        OT Problem List: Decreased strength;Decreased range of motion;Decreased activity tolerance;Impaired balance (sitting and/or standing);Decreased cognition;Decreased safety awareness;Decreased knowledge of use of DME or AE;Impaired vision/perception      OT Treatment/Interventions: Self-care/ADL training;Therapeutic exercise;Neuromuscular education;Energy conservation;DME and/or AE instruction;Manual therapy;Modalities;Balance training;Patient/family education;Visual/perceptual remediation/compensation;Cognitive remediation/compensation;Therapeutic activities;Splinting    OT Goals(Current goals can be found in the care plan section) Acute Rehab OT Goals Patient Stated Goal: none stated OT Goal Formulation: With patient Time For Goal Achievement: 07/16/22 Potential to Achieve Goals: Good   OT Frequency: Min 2X/week    Co-evaluation              AM-PAC OT "6 Clicks" Daily Activity     Outcome Measure Help from another person eating meals?: A Little Help from another person taking care of personal grooming?: A  Little Help from another person toileting, which includes using toliet, bedpan, or urinal?: A Lot Help from another person bathing (including washing, rinsing, drying)?: A Lot Help from another person to put on and taking off regular upper body clothing?: A Little Help from another person to  put on and taking off regular lower body clothing?: A Lot 6 Click Score: 15   End of Session Equipment Utilized During Treatment: Gait belt;Rolling walker (2 wheels) Nurse Communication: Mobility status  Activity Tolerance: Patient tolerated treatment well Patient left: in bed;with call bell/phone within reach;with bed alarm set;with family/visitor present  OT Visit Diagnosis: Muscle weakness (generalized) (M62.81);Unsteadiness on feet (R26.81);Low vision, both eyes (H54.2)                Time: 1010-1026 OT Time Calculation (min): 16 min Charges:  OT General Charges $OT Visit: 1 Visit OT Evaluation $OT Eval Moderate Complexity: 1 Mod  [facility] MS, OTR/L ascom [phone]  07/02/22, 1:43 PM

## 2022-07-02 NOTE — Progress Notes (Signed)
Patient received from [facility] in stable condition via bed.

## 2022-07-03 LAB — CBC
HCT: 39.4 % (ref 36.0–46.0)
Hemoglobin: 13.1 g/dL (ref 12.0–15.0)
MCH: 30.6 pg (ref 26.0–34.0)
MCHC: 33.2 g/dL (ref 30.0–36.0)
MCV: 92.1 fL (ref 80.0–100.0)
Platelets: 123 10*3/uL — ABNORMAL LOW (ref 150–400)
RBC: 4.28 MIL/uL (ref 3.87–5.11)
RDW: 13 % (ref 11.5–15.5)
WBC: 6.4 10*3/uL (ref 4.0–10.5)
nRBC: 0 % (ref 0.0–0.2)

## 2022-07-03 LAB — BASIC METABOLIC PANEL
Anion gap: 8 (ref 5–15)
BUN: 20 mg/dL (ref 8–23)
CO2: 27 mmol/L (ref 22–32)
Calcium: 9.1 mg/dL (ref 8.9–10.3)
Chloride: 104 mmol/L (ref 98–111)
Creatinine, Ser: 0.76 mg/dL (ref 0.44–1.00)
GFR, Estimated: >60 mL/min (ref >60)
Glucose, Bld: 98 mg/dL (ref 70–99)
Potassium: 3.6 mmol/L (ref 3.5–5.1)
Sodium: 139 mmol/L (ref 135–145)

## 2022-07-03 LAB — PHOSPHORUS: Phosphorus: 4.2 mg/dL (ref 2.5–4.6)

## 2022-07-03 LAB — MAGNESIUM: Magnesium: 2.2 mg/dL (ref 1.7–2.4)

## 2022-07-03 LAB — T3, FREE: T3, Free: 2.9 pg/mL (ref 2.0–4.4)

## 2022-07-03 NOTE — TOC Progression Note (Signed)
Transition of Care [facility]) - Progression Note    Patient Details  Name: [name] [name] MRN: [id number] Date of Birth: May 06, 1927  [facility] CM/SW Contact  Gerilyn Pilgrim, LCSW Phone Number: 07/03/2022, 11:42 AM  Clinical Narrative:   PT recs changed to home with 24/7 with [facility]. CSW spoke with patients granddaughter who confirmed. Granddaughter would like HH and reports not working with any other agency and no preference. Georgina Snell with [facility] contacted. Pt accepted for [facility] PT/OT/AID/Speech. MD notified.          Expected Discharge Plan and Services                                               Social Determinants of Health (SDOH) Interventions SDOH Screenings   Food Insecurity: No Food Insecurity (07/01/2022)  Housing: Low Risk  (07/01/2022)  Transportation Needs: No Transportation Needs (07/01/2022)  Utilities: Not At Risk (07/01/2022)  Tobacco Use: Low Risk  (07/01/2022)    Readmission Risk Interventions     No data to display

## 2022-07-03 NOTE — Progress Notes (Signed)
Physical Therapy Treatment Patient Details Name: [name] [name] MRN: [id number] DOB: Jun 28, 1927 Today's Date: 07/03/2022   History of Present Illness Pt is a 89 y/o F admitted on 07/01/22 after presenting with c/o dizziness, LLE weakness, & decreased sensation in BUE, numbness in BLE feet. Pt endorsed 1 fall prior to admission. MRI reveals acute R PCA infarct. PMH: HTN, macular degeneration, depression    PT Comments    Pt was long sitting in bed upon arrival. She is A and O x 3. Agrees to session and remains cooperative and pleasant throughout. Did have some cognition deficits come to light throughout session but author able to confirm with family that this is not new. Pt was easily and safely able to exit bed, stand to RW, and ambulate ~ 200 ft without LOB. No SOB or fatigue noted. Chief Strategy Officer discussed case with pt's grand daughter/ caregiver. DC recs updated to home with HHPT. Granddaughter reports being able to provide the level of assistance she will require to safely DC home. TOC/MD updated. Tentative/expected DC home tomorrow per DC.    Recommendations for follow up therapy are one component of a multi-disciplinary discharge planning process, led by the attending physician.  Recommendations may be updated based on patient status, additional functional criteria and insurance authorization.  Follow Up Recommendations  Home health PT Can patient physically be transported by private vehicle: Yes   Assistance Recommended at Discharge Frequent or constant Supervision/Assistance  Patient can return home with the following A little help with walking and/or transfers;A little help with bathing/dressing/bathroom;Assistance with cooking/housework;Direct supervision/assist for medications management;Direct supervision/assist for financial management;Assist for transportation;Help with stairs or ramp for entrance   Equipment Recommendations  Rolling walker (2 wheels) (pt endorses only havng rollator at  home. Recommend RW at DC)       Precautions / Restrictions Precautions Precautions: Fall Precaution Comments: low vision Restrictions Weight Bearing Restrictions: No     Mobility  Bed Mobility Overal bed mobility: Needs Assistance Bed Mobility: Supine to Sit  Supine to sit: Supervision  General bed mobility comments: no physical assistance required to exit L side of bed. Vcs for technique and sequencing improvements only    Transfers Overall transfer level: Needs assistance Equipment used: Rolling walker (2 wheels) Transfers: Sit to/from Stand Sit to Stand: Min guard, Supervision  General transfer comment: CGA for first STS however supervision throughout the remainder of session    Ambulation/Gait Ambulation/Gait assistance: Supervision Gait Distance (Feet): 200 Feet Assistive device: Rolling walker (2 wheels) Gait Pattern/deviations: Step-through pattern, Trunk flexed Gait velocity: decreased  General Gait Details: Pt has flexed posture however per family and visitor, this is at baseline. Pt was easily able to ambulate 200 ft with RW. no LOB or unsteadiness noted.    Balance Overall balance assessment: Needs assistance Sitting-balance support: Feet supported Sitting balance-Leahy Scale: Good Sitting balance - Comments: pt sat EOB x ~ 5 minutes while performing reaching outside BOS. overall sitting balance was good today.   Standing balance support: Reliant on assistive device for balance, During functional activity, Bilateral upper extremity supported Standing balance-Leahy Scale: Fair Standing balance comment: reliant on RW for safety with dynamic standing balance activity       Cognition Arousal/Alertness: Awake/alert Behavior During Therapy: WFL for tasks assessed/performed Overall Cognitive Status: Impaired/Different from baseline  General Comments: Pt is A&Ox4, but presents with some "forgetfulness" during session with friend correcting responses to PLOF/home  set up questions.           General  Comments General comments (skin integrity, edema, etc.): Discussed at length with pt/pt's grand daughter, and medical team about DC disposition. DC recs are being updated to home with HHPT. Per pt's family, they will be able to provide level of care pt currently requires. [facility] services are most appropriate at this time      Pertinent Vitals/Pain Pain Assessment Pain Assessment: No/denies pain     PT Goals (current goals can now be found in the care plan section) Acute Rehab PT Goals Patient Stated Goal: go home tomorrow Progress towards PT goals: Progressing toward goals    Frequency    7X/week      PT Plan Discharge plan needs to be updated       AM-PAC PT "6 Clicks" Mobility   Outcome Measure  Help needed turning from your back to your side while in a flat bed without using bedrails?: A Little Help needed moving from lying on your back to sitting on the side of a flat bed without using bedrails?: A Little Help needed moving to and from a bed to a chair (including a wheelchair)?: A Little Help needed standing up from a chair using your arms (e.g., wheelchair or bedside chair)?: A Little Help needed to walk in hospital room?: A Little Help needed climbing 3-5 steps with a railing? : A Little 6 Click Score: 18    End of Session Equipment Utilized During Treatment: Gait belt Activity Tolerance: Patient tolerated treatment well;Patient limited by fatigue Patient left: in chair;with call bell/phone within reach;with chair alarm set;with family/visitor present Nurse Communication: Mobility status PT Visit Diagnosis: Muscle weakness (generalized) (M62.81);Other abnormalities of gait and mobility (R26.89);Unsteadiness on feet (R26.81)     Time: 1040-1055 PT Time Calculation (min) (ACUTE ONLY): 15 min  Charges:  $Gait Training: 8-22 mins                     Julaine Fusi PTA 07/03/22, 11:54 AM

## 2022-07-03 NOTE — Progress Notes (Signed)
PROGRESS NOTE    [name] [name]  [id number] DOB: 01/01/1928 DOA: 07/01/2022 PCP: Ivor Costa, MD   Brief Narrative:  This 89 years old female with PMH significant for hypertension, macular degeneration presented to the ED with complaints of dizziness, left leg weakness, decreased sensation in both hands, numbness in both feet.  Daughter reports that patient started having nausea 3 days ago,  She started complaining of dizziness, lightheadedness, weakness and has developed numbness in both hands yesterday. She subsequently fell. She denies any significant injury.  Denies any loss of consciousness.  She was hypertensive in the ED. Chest x-ray showed right basilar atelectasis. CT head and C-spine showed acute to subacute right PCA infarct, No acute cervical spine fracture.  Neurology was consulted,  Patient is admitted for stroke workup.  Assessment & Plan:   Principal Problem:   Stroke [facility]) Active Problems:   HTN (hypertension)   Depression   Fall at home, initial encounter   Thyroid nodule  Acute CVA: Patient presented with dizziness, left leg weakness, bilateral hand numbness. CT head showed acute to subacute right PCA infarct. Neurology is consulted. MRI brain confirmed acute right PCA infarct. CTA head and neck: No large vessel occlusion, widely patent carotid and vertebral arteries. Continue aspirin and Plavix for 90 days followed by aspirin only therapy. Neurology recommended DAPT for 90 days due to severe intracranial stenosis. Continue Lipitor 40 mg daily Patient is out of window for permissive hypertension. Obtain 2D echocardiogram. PT and OT recommended SNF.  TOC working on finding placement. Obtain CT head for any change in neuroexam. EEG no evidence of seizures.  Essential hypertension: Patient presented with BP 260/98 >172/84 Patient used to be on Zebeta which has been on hold recently due to low blood pressure. Restart Zebeta. Continue hydralazine IV as  needed.  Major depression: Continue Remeron.  S/P fall at home: Continue Fall precautions Continue PT and OT eval.  Thyroid nodule: Incidental finding Continue thyroid ultrasound. Obtain Thyroid profile.   DVT prophylaxis:  Lovenox. Code Status:DNR Family Communication: Friend at bed side. Disposition Plan:   Admitted for Stroke workup.  PT and OT recommended SNF.  Echo is pending  Consultants:  Neurology  Procedures: CT Head, MRI , Echo  Antimicrobials: Anti-infectives (From admission, onward)    None       Subjective: Patient was seen and examined at bedside.  Overnight events noted. Patient reports doing better, she was having breakfast when examined. She reports feeling slightly better,  still has numbness and weakness. She refused to go to skilled nursing facility,  states she prefers to go home.  Objective: Vitals:   07/02/22 2045 07/02/22 2351 07/03/22 0333 07/03/22 0748  BP: 136/86 (!) 173/88 (!) 157/88 (!) 168/85  Pulse: 77 82 82 77  Resp: '18 18 20 16  '$ Temp: 97.6 F (36.4 C) 98.6 F (37 C) 97.7 F (36.5 C) 97.7 F (36.5 C)  TempSrc: Oral  Oral Oral  SpO2: 96% 98% 97% 97%  Weight:      Height:        Intake/Output Summary (Last 24 hours) at 07/03/2022 1113 Last data filed at 07/03/2022 1000 Gross per 24 hour  Intake 480 ml  Output --  Net 480 ml   Filed Weights   07/01/22 1112  Weight: 52.2 kg    Examination:  General exam: Appears comfortable, not in any acute distress, deconditioned. Respiratory system: CTA bilaterally, respiratory for normal, RR 15. Cardiovascular system: S1 & S2 heard, regular rate and  rhythm, no murmur. Gastrointestinal system: Abdomen is soft, non tender, non distended, BS+ Central nervous system: Alert and oriented x 2, no focal neurological deficits. Extremities: No edema, no cyanosis, no clubbing. Skin: No rashes, lesions or ulcers Psychiatry: Judgement and insight appear normal. Mood & affect appropriate.      Data Reviewed: I have personally reviewed following labs and imaging studies  CBC: Recent Labs  Lab 07/01/22 1138 07/03/22 0434  WBC 4.2 6.4  NEUTROABS 2.5  --   HGB 13.5 13.1  HCT 41.5 39.4  MCV 93.0 92.1  PLT 139* [id number]*   Basic Metabolic Panel: Recent Labs  Lab 07/01/22 1138 07/03/22 0434  NA 140 139  K 3.7 3.6  CL 105 104  CO2 24 27  GLUCOSE 100* 98  BUN 13 20  CREATININE 0.74 0.76  CALCIUM 9.2 9.1  MG 2.2 2.2  PHOS  --  4.2   GFR: Estimated Creatinine Clearance: 34.7 mL/min (by C-G formula based on SCr of 0.76 mg/dL). Liver Function Tests: Recent Labs  Lab 07/01/22 1138  AST 24  ALT 14  ALKPHOS 71  BILITOT 0.7  PROT 6.6  ALBUMIN 4.1   No results for input(s): "LIPASE", "AMYLASE" in the last 168 hours. No results for input(s): "AMMONIA" in the last 168 hours. Coagulation Profile: No results for input(s): "INR", "PROTIME" in the last 168 hours. Cardiac Enzymes: No results for input(s): "CKTOTAL", "CKMB", "CKMBINDEX", "TROPONINI" in the last 168 hours. BNP (last 3 results) No results for input(s): "PROBNP" in the last 8760 hours. HbA1C: Recent Labs    07/01/22 1137  HGBA1C 5.6   CBG: Recent Labs  Lab 07/01/22 2055 07/02/22 0753  GLUCAP 156* 102*   Lipid Profile: Recent Labs    07/02/22 0508  CHOL 148  HDL 60  LDLCALC 71  TRIG 86  CHOLHDL 2.5   Thyroid Function Tests: Recent Labs    07/01/22 1444 07/01/22 1535 07/02/22 0508  TSH 1.619  --   --   FREET4  --  0.99  --   T3FREE  --   --  2.9   Anemia Panel: No results for input(s): "VITAMINB12", "FOLATE", "FERRITIN", "TIBC", "IRON", "RETICCTPCT" in the last 72 hours. Sepsis Labs: Recent Labs  Lab 07/01/22 1138 07/01/22 1451  LATICACIDVEN 1.5 1.4    Recent Results (from the past 240 hour(s))  Resp panel by RT-PCR (RSV, Flu A&B, Covid) Anterior Nasal Swab     Status: None   Collection Time: 07/01/22 11:38 AM   Specimen: Anterior Nasal Swab  Result Value Ref Range  Status   SARS Coronavirus 2 by RT PCR NEGATIVE NEGATIVE Final    Comment: (NOTE) SARS-CoV-2 target nucleic acids are NOT DETECTED.  The SARS-CoV-2 RNA is generally detectable in upper respiratory specimens during the acute phase of infection. The lowest concentration of SARS-CoV-2 viral copies this assay can detect is 138 copies/mL. A negative result does not preclude SARS-Cov-2 infection and should not be used as the sole basis for treatment or other patient management decisions. A negative result may occur with  improper specimen collection/handling, submission of specimen other than nasopharyngeal swab, presence of viral mutation(s) within the areas targeted by this assay, and inadequate number of viral copies(<138 copies/mL). A negative result must be combined with clinical observations, patient history, and epidemiological information. The expected result is Negative.  Fact Sheet for Patients:  EntrepreneurPulse.com.au  Fact Sheet for Healthcare Providers:  IncredibleEmployment.be  This test is no t yet approved or cleared by the [place]  States FDA and  has been authorized for detection and/or diagnosis of SARS-CoV-2 by FDA under an Emergency Use Authorization (EUA). This EUA will remain  in effect (meaning this test can be used) for the duration of the COVID-19 declaration under Section 564(b)(1) of the Act, 21 U.S.C.section 360bbb-3(b)(1), unless the authorization is terminated  or revoked sooner.       Influenza A by PCR NEGATIVE NEGATIVE Final   Influenza B by PCR NEGATIVE NEGATIVE Final    Comment: (NOTE) The Xpert Xpress SARS-CoV-2/FLU/RSV plus assay is intended as an aid in the diagnosis of influenza from Nasopharyngeal swab specimens and should not be used as a sole basis for treatment. Nasal washings and aspirates are unacceptable for Xpert Xpress SARS-CoV-2/FLU/RSV testing.  Fact Sheet for  Patients: EntrepreneurPulse.com.au  Fact Sheet for Healthcare Providers: IncredibleEmployment.be  This test is not yet approved or cleared by the [place] FDA and has been authorized for detection and/or diagnosis of SARS-CoV-2 by FDA under an Emergency Use Authorization (EUA). This EUA will remain in effect (meaning this test can be used) for the duration of the COVID-19 declaration under Section 564(b)(1) of the Act, 21 U.S.C. section 360bbb-3(b)(1), unless the authorization is terminated or revoked.     Resp Syncytial Virus by PCR NEGATIVE NEGATIVE Final    Comment: (NOTE) Fact Sheet for Patients: EntrepreneurPulse.com.au  Fact Sheet for Healthcare Providers: IncredibleEmployment.be  This test is not yet approved or cleared by the [place] FDA and has been authorized for detection and/or diagnosis of SARS-CoV-2 by FDA under an Emergency Use Authorization (EUA). This EUA will remain in effect (meaning this test can be used) for the duration of the COVID-19 declaration under Section 564(b)(1) of the Act, 21 U.S.C. section 360bbb-3(b)(1), unless the authorization is terminated or revoked.  Performed at [facility], [address], [place], [place] [address]     Radiology Studies: EEG adult  Result Date: 2022-07-27 Derek Jack, MD     27-Jul-2022  7:10 PM Routine EEG Report [name] [name] is a 89 y.o. female with a history of altered mental status who is undergoing an EEG to evaluate for seizures. Report: This EEG was acquired with electrodes placed according to the International 10-20 electrode system (including Fp1, Fp2, F3, F4, C3, C4, P3, P4, O1, O2, T3, T4, T5, T6, A1, A2, Fz, Cz, Pz). The following electrodes were missing or displaced: none. The occipital dominant rhythm was 6-7 Hz. This activity is reactive to stimulation. Drowsiness was manifested by background fragmentation;  deeper stages of sleep were identified by K complexes and sleep spindles. There was no focal slowing. There were no interictal epileptiform discharges. There were no electrographic seizures identified. Photic stimulation and hyperventilation were not performed. Impression and clinical correlation: This EEG was obtained while awake and asleep and is abnormal due to mild diffuse slowing indicative of global cerebral dysfunction. Epileptiform abnormalities were not seen during this recording. Su Monks, MD [facility] [phone] If 7pm- 7am, please page neurology on call as listed in [place].   ECHOCARDIOGRAM COMPLETE  Result Date: Jul 27, 2022    ECHOCARDIOGRAM REPORT   Patient Name:   [name] [name] Date of Exam: 2022/07/27 Medical Rec #:  [id number]      Height:       62.0 in Accession #:    [id number]     Weight:       115.0 lb Date of Birth:  11-08-1927       BSA:  1.511 m Patient Age:    54 years       BP:           171/82 mmHg Patient Gender: F              HR:           73 bpm. Exam Location:  [facility] Procedure: 2D Echo, Cardiac Doppler and Color Doppler Indications:     Stroke  History:         Patient has no prior history of Echocardiogram examinations.                  Stroke; Risk Factors:Hypertension.  Sonographer:     Wenda Low Referring Phys:  Garrett Diagnosing Phys: Kathlyn Sacramento MD IMPRESSIONS  1. Left ventricular ejection fraction, by estimation, is 60 to 65%. The left ventricle has normal function. The left ventricle has no regional wall motion abnormalities. There is moderate asymmetric left ventricular hypertrophy of the basal-septal segment. Left ventricular diastolic parameters are consistent with Grade I diastolic dysfunction (impaired relaxation).  2. Right ventricular systolic function is normal. The right ventricular size is normal. There is normal pulmonary artery systolic pressure.  3. Right atrial size was mildly dilated.  4. The mitral valve is  abnormal. Mild to moderate mitral valve regurgitation. No evidence of mitral stenosis. Moderate mitral annular calcification.  5. Tricuspid valve regurgitation is moderate.  6. The aortic valve is normal in structure. Aortic valve regurgitation is not visualized. Aortic valve sclerosis/calcification is present, without any evidence of aortic stenosis.  7. The inferior vena cava is normal in size with greater than 50% respiratory variability, suggesting right atrial pressure of 3 mmHg. FINDINGS  Left Ventricle: Left ventricular ejection fraction, by estimation, is 60 to 65%. The left ventricle has normal function. The left ventricle has no regional wall motion abnormalities. The left ventricular internal cavity size was normal in size. There is  moderate asymmetric left ventricular hypertrophy of the basal-septal segment. Left ventricular diastolic parameters are consistent with Grade I diastolic dysfunction (impaired relaxation). Right Ventricle: The right ventricular size is normal. No increase in right ventricular wall thickness. Right ventricular systolic function is normal. There is normal pulmonary artery systolic pressure. The tricuspid regurgitant velocity is 2.55 m/s, and  with an assumed right atrial pressure of 3 mmHg, the estimated right ventricular systolic pressure is 0000000 mmHg. Left Atrium: Left atrial size was normal in size. Right Atrium: Right atrial size was mildly dilated. Pericardium: There is no evidence of pericardial effusion. Mitral Valve: The mitral valve is abnormal. There is moderate thickening of the mitral valve leaflet(s). There is moderate calcification of the mitral valve leaflet(s). Moderate mitral annular calcification. Mild to moderate mitral valve regurgitation. No evidence of mitral valve stenosis. MV peak gradient, 6.1 mmHg. The mean mitral valve gradient is 2.0 mmHg. Tricuspid Valve: The tricuspid valve is normal in structure. Tricuspid valve regurgitation is moderate . No  evidence of tricuspid stenosis. Aortic Valve: The aortic valve is normal in structure. Aortic valve regurgitation is not visualized. Aortic valve sclerosis/calcification is present, without any evidence of aortic stenosis. Aortic valve mean gradient measures 3.0 mmHg. Aortic valve peak  gradient measures 6.0 mmHg. Aortic valve area, by VTI measures 2.39 cm. Pulmonic Valve: The pulmonic valve was normal in structure. Pulmonic valve regurgitation is not visualized. No evidence of pulmonic stenosis. Aorta: The aortic root is normal in size and structure. Venous: The inferior vena cava is normal in size with  greater than 50% respiratory variability, suggesting right atrial pressure of 3 mmHg. IAS/Shunts: No atrial level shunt detected by color flow Doppler.  LEFT VENTRICLE PLAX 2D LVIDd:         4.60 cm   Diastology LVIDs:         2.70 cm   LV e' medial:    4.57 cm/s LV PW:         1.10 cm   LV E/e' medial:  14.8 LV IVS:        1.10 cm   LV e' lateral:   5.33 cm/s LVOT diam:     1.80 cm   LV E/e' lateral: 12.7 LV SV:         59 LV SV Index:   39 LVOT Area:     2.54 cm  RIGHT VENTRICLE RV Basal diam:  3.15 cm RV Mid diam:    2.50 cm RV S prime:     10.70 cm/s TAPSE (M-mode): 2.2 cm LEFT ATRIUM             Index        RIGHT ATRIUM           Index LA diam:        3.60 cm 2.38 cm/m   RA Area:     18.90 cm LA Vol (A2C):   54.3 ml 35.94 ml/m  RA Volume:   51.00 ml  33.75 ml/m LA Vol (A4C):   41.1 ml 27.20 ml/m LA Biplane Vol: 49.4 ml 32.69 ml/m  AORTIC VALVE                    PULMONIC VALVE AV Area (Vmax):    2.34 cm     PV Vmax:       0.80 m/s AV Area (Vmean):   2.28 cm     PV Peak grad:  2.5 mmHg AV Area (VTI):     2.39 cm AV Vmax:           122.00 cm/s AV Vmean:          81.000 cm/s AV VTI:            0.247 m AV Peak Grad:      6.0 mmHg AV Mean Grad:      3.0 mmHg LVOT Vmax:         112.00 cm/s LVOT Vmean:        72.500 cm/s LVOT VTI:          0.232 m LVOT/AV VTI ratio: 0.94  AORTA Ao Root diam: 3.00 cm MITRAL  VALVE                TRICUSPID VALVE MV Area (PHT): 2.79 cm     TR Peak grad:   26.0 mmHg MV Area VTI:   1.90 cm     TR Vmax:        255.00 cm/s MV Peak grad:  6.1 mmHg MV Mean grad:  2.0 mmHg     SHUNTS MV Vmax:       1.23 m/s     Systemic VTI:  0.23 m MV Vmean:      63.9 cm/s    Systemic Diam: 1.80 cm MV Decel Time: 272 msec MV E velocity: 67.60 cm/s MV A velocity: 103.00 cm/s MV E/A ratio:  0.66 Kathlyn Sacramento MD Electronically signed by Kathlyn Sacramento MD Signature Date/Time: 07/02/2022/4:15:01 PM    Final    US THYROID  Result Date: 07/02/2022  CLINICAL DATA:  Incidental on CT. EXAM: THYROID ULTRASOUND TECHNIQUE: Ultrasound examination of the thyroid gland and adjacent soft tissues was performed. COMPARISON:  None Available. FINDINGS: Parenchymal Echotexture: Moderately heterogenous Isthmus: 0.3 cm Right lobe: 4.6 x 3.0 x 1.4 cm Left lobe: 3.2 x 1.5 x 1.1 cm _________________________________________________________ Estimated total number of nodules >/= 1 cm: 3 Number of spongiform nodules >/=  2 cm not described below (TR1): 0 Number of mixed cystic and solid nodules >/= 1.5 cm not described below (TR2): 0 _________________________________________________________ Nodule # 1: Location: Right; Mid Maximum size: 2.2 cm; Other 2 dimensions: 2.1 x 1.6 cm Composition: solid/almost completely solid (2) Echogenicity: isoechoic (1) Shape: taller-than-wide (3) Margins: ill-defined (0) Echogenic foci: none (0) ACR TI-RADS total points: 6. ACR TI-RADS risk category: TR4 (4-6 points). ACR TI-RADS recommendations: **Given size (>/= 1.5 cm) and appearance, fine needle aspiration of this moderately suspicious nodule should be considered based on TI-RADS criteria. _________________________________________________________ Nodule # 2: Ill-defined grossly 1.4 cm isoechoic solid nodule in the right lower gland. This either represents a pseudo nodule, or a TI-RADS category 3 nodule. In either case, it does not meet criteria to  warrant further imaging follow-up. Nodule # 3: 1 cm spongiform nodule in the left mid gland. TI-RADS category 2. This nodule does NOT meet TI-RADS criteria for biopsy or dedicated follow-up. IMPRESSION: 1. Approximately 2.2 cm TI-RADS category 4 nodule in the right mid gland meets criteria to consider fine-needle aspiration biopsy. Given relatively advanced age, imaging surveillance could also be considered. 2. Other small visualized thyroid nodule/pseudo nodules do not meet criteria to warrant further evaluation. The above is in keeping with the ACR TI-RADS recommendations - J Am Coll Radiol 2017;14:587-595. Electronically Signed   By: Jacqulynn Cadet M.D.   On: 07/02/2022 05:55   CT VENOGRAM HEAD  Result Date: 07/01/2022 CLINICAL DATA:  Dural venous sinus thrombosis suspected. EXAM: CT VENOGRAM HEAD TECHNIQUE: Venographic phase images of the brain were obtained following the administration of intravenous contrast. Multiplanar reformats and maximum intensity projections were generated. RADIATION DOSE REDUCTION: This exam was performed according to the departmental dose-optimization program which includes automated exposure control, adjustment of the mA and/or kV according to patient size and/or use of iterative reconstruction technique. CONTRAST:  9m OMNIPAQUE IOHEXOL 350 MG/ML SOLN COMPARISON:  None Available. FINDINGS: The superior sagittal sinus, internal cerebral veins, vein of Galen, straight sinus, transverse sinuses, sigmoid sinuses, and jugular bulbs are patent without evidence of thrombus or significant stenosis. IMPRESSION: No evidence of dural venous sinus thrombosis. Electronically Signed   By: ALogan BoresM.D.   On: 07/01/2022 18:45   CT ANGIO HEAD NECK W WO CM  Result Date: 07/01/2022 CLINICAL DATA:  Neuro deficit, acute, stroke suspected. Acute right PCA infarct. EXAM: CT ANGIOGRAPHY HEAD AND NECK TECHNIQUE: Multidetector CT imaging of the head and neck was performed using the standard  protocol during bolus administration of intravenous contrast. Multiplanar CT image reconstructions and MIPs were obtained to evaluate the vascular anatomy. Carotid stenosis measurements (when applicable) are obtained utilizing NASCET criteria, using the distal internal carotid diameter as the denominator. RADIATION DOSE REDUCTION: This exam was performed according to the departmental dose-optimization program which includes automated exposure control, adjustment of the mA and/or kV according to patient size and/or use of iterative reconstruction technique. CONTRAST:  766mOMNIPAQUE IOHEXOL 350 MG/ML SOLN COMPARISON:  None Available. FINDINGS: CTA NECK FINDINGS Aortic arch: Standard 3 vessel aortic arch with mild atherosclerotic plaque. Widely patent brachiocephalic and subclavian arteries. Right  carotid system: Patent with a small amount of calcified plaque at the carotid bifurcation. No evidence of a significant stenosis or dissection. Left carotid system: Patent with a small amount of calcified plaque at the carotid bifurcation. No evidence of a significant stenosis or dissection. Vertebral arteries: Patent without evidence of stenosis, dissection, or significant atherosclerosis. Mildly dominant left vertebral artery. Skeleton: Widespread advanced facet arthrosis and disc degeneration in the cervical spine with mild multilevel degenerative listhesis. Other neck: Multiple right thyroid nodules measuring up to approximately 2 cm. No evidence of cervical lymphadenopathy. Upper chest: Biapical pleuroparenchymal lung scarring. Review of the MIP images confirms the above findings CTA HEAD FINDINGS Anterior circulation: The internal carotid arteries are patent from skull base to carotid termini with mild atherosclerosis bilaterally not resulting in significant stenosis. ACAs and MCAs are patent without evidence of a significant A1 or M1 stenosis. There is moderate MCA branch vessel atherosclerosis bilaterally, and there  are severe left A2 and bilateral A3 stenoses. No aneurysm identified. Posterior circulation: Intracranial vertebral arteries are patent to the basilar with mild atheromatous irregularity but no significant stenosis. Patent PICA, AICA, and SCA origins are seen bilaterally. The basilar artery is widely patent. Posterior communicating arteries are diminutive or absent. The PCAs are patent with a severe, near occlusive stenosis of the distal right P2 segment and with were distal branch vessel irregularity noted bilaterally. No aneurysm is identified. Venous sinuses: More fully evaluated on the separate venogram. Anatomic variants: None. Review of the MIP images confirms the above findings IMPRESSION: 1. No emergent large vessel occlusion. 2. Intracranial atherosclerosis including severe distal right P2 and left A2 stenoses. 3. Widely patent cervical carotid and vertebral arteries. 4. 2 cm right thyroid nodule. Recommend non-emergent thyroid ultrasound if clinically warranted given patient age. Reference: J Am Coll Radiol. 2015 Feb;12(2): 143-50 5.  Aortic Atherosclerosis (ICD10-I70.0). Electronically Signed   By: Logan Bores M.D.   On: 07/01/2022 18:44   MR BRAIN WO CONTRAST  Result Date: 07/01/2022 CLINICAL DATA:  Neuro deficit, acute, stroke suspected. Right PCA infarct on CT. EXAM: MRI HEAD WITHOUT CONTRAST TECHNIQUE: Multiplanar, multiecho pulse sequences of the brain and surrounding structures were obtained without intravenous contrast. COMPARISON:  Head CT 07/01/2022 FINDINGS: Brain: There is an acute right PCA infarct involving portions of the occipital lobe, posterior temporal lobe including hippocampal tail, splenium of the corpus callosum, and lateral right thalamus. A single focus of chronic microhemorrhage is noted in the right parietal periventricular white matter. T2 hyperintensities elsewhere in the cerebral white matter bilaterally are nonspecific but compatible with mild chronic small vessel  ischemic disease. There is generalized cerebral atrophy. No mass, midline shift, or extra-axial fluid collection is identified. Vascular: Major intracranial vascular flow voids are preserved. Skull and upper cervical spine: Unremarkable bone marrow signal. Sinuses/Orbits: Bilateral cataract extraction. Paranasal sinuses and mastoid air cells are clear. Other: None. IMPRESSION: 1. Acute right PCA infarct. 2. Mild chronic small vessel ischemic disease. Electronically Signed   By: Logan Bores M.D.   On: 07/01/2022 18:04   CT Head Wo Contrast  Result Date: 07/01/2022 CLINICAL DATA:  Head trauma, minor (Age >= 65y); Neck trauma (Age >= 65y). Fall yesterday. EXAM: CT HEAD WITHOUT CONTRAST CT CERVICAL SPINE WITHOUT CONTRAST TECHNIQUE: Multidetector CT imaging of the head and cervical spine was performed following the standard protocol without intravenous contrast. Multiplanar CT image reconstructions of the cervical spine were also generated. RADIATION DOSE REDUCTION: This exam was performed according to the departmental dose-optimization program which  includes automated exposure control, adjustment of the mA and/or kV according to patient size and/or use of iterative reconstruction technique. COMPARISON:  None Available. FINDINGS: CT HEAD FINDINGS Brain: A 3 cm region of confluent hypodensity involving cortex and white matter in the right occipital lobe is consistent with an acute to subacute PCA infarct. No intracranial mass effect, acute intracranial hemorrhage, or extra-axial fluid collection is identified. Hypodensities elsewhere in the cerebral white matter bilaterally are nonspecific but compatible with mild chronic small vessel ischemic disease. Cerebral atrophy is mild for age. Vascular: Calcified atherosclerosis at the skull base. Small calcification at the basilar tip. Skull: No acute fracture or suspicious osseous lesion. Sinuses/Orbits: Visualized paranasal sinuses and mastoid air cells are clear.  Bilateral cataract extraction. Other: None. CT CERVICAL SPINE FINDINGS Alignment: Reversal of the normal cervical lordosis. Grade 1 anterolisthesis of C3 on C4, C4 on C5, C5 on C6, C7 on T1, T1 on T2, and T2 on T3. Mild right convex curvature of the cervical spine. Skull base and vertebrae: No acute fracture or suspicious osseous lesion. Asymmetrically advanced left-sided C1-2 arthropathy. Interbody and facet ankylosis at C6-7. Bridging anterior vertebral osteophytes at C7-T1 and T1-2. Soft tissues and spinal canal: No prevertebral fluid or swelling. No visible canal hematoma. Disc levels: Widespread disc degeneration, most advanced at C5-6. Widespread severe facet arthrosis throughout the cervical and included upper thoracic spine. Advanced neural foraminal stenosis bilaterally at C3-4 and C4-5 and on the left at C5-6. Suspected moderate spinal stenosis at C3-4. Upper chest: Biapical pleuroparenchymal lung scarring. Other: Right thyroid nodules measuring up to 2 cm. IMPRESSION: 1. Acute to subacute right PCA infarct. 2. No acute cervical spine fracture. 3. Advanced cervical disc and facet degeneration. 4. 2 cm incidental right thyroid nodule. Recommend non-emergent thyroid ultrasound if clinically warranted given patient age. Reference: J Am Coll Radiol. 2015 Feb;12(2): 143-50 Electronically Signed   By: Logan Bores M.D.   On: 07/01/2022 12:11   CT Cervical Spine Wo Contrast  Result Date: 07/01/2022 CLINICAL DATA:  Head trauma, minor (Age >= 65y); Neck trauma (Age >= 65y). Fall yesterday. EXAM: CT HEAD WITHOUT CONTRAST CT CERVICAL SPINE WITHOUT CONTRAST TECHNIQUE: Multidetector CT imaging of the head and cervical spine was performed following the standard protocol without intravenous contrast. Multiplanar CT image reconstructions of the cervical spine were also generated. RADIATION DOSE REDUCTION: This exam was performed according to the departmental dose-optimization program which includes automated exposure  control, adjustment of the mA and/or kV according to patient size and/or use of iterative reconstruction technique. COMPARISON:  None Available. FINDINGS: CT HEAD FINDINGS Brain: A 3 cm region of confluent hypodensity involving cortex and white matter in the right occipital lobe is consistent with an acute to subacute PCA infarct. No intracranial mass effect, acute intracranial hemorrhage, or extra-axial fluid collection is identified. Hypodensities elsewhere in the cerebral white matter bilaterally are nonspecific but compatible with mild chronic small vessel ischemic disease. Cerebral atrophy is mild for age. Vascular: Calcified atherosclerosis at the skull base. Small calcification at the basilar tip. Skull: No acute fracture or suspicious osseous lesion. Sinuses/Orbits: Visualized paranasal sinuses and mastoid air cells are clear. Bilateral cataract extraction. Other: None. CT CERVICAL SPINE FINDINGS Alignment: Reversal of the normal cervical lordosis. Grade 1 anterolisthesis of C3 on C4, C4 on C5, C5 on C6, C7 on T1, T1 on T2, and T2 on T3. Mild right convex curvature of the cervical spine. Skull base and vertebrae: No acute fracture or suspicious osseous lesion. Asymmetrically advanced left-sided  C1-2 arthropathy. Interbody and facet ankylosis at C6-7. Bridging anterior vertebral osteophytes at C7-T1 and T1-2. Soft tissues and spinal canal: No prevertebral fluid or swelling. No visible canal hematoma. Disc levels: Widespread disc degeneration, most advanced at C5-6. Widespread severe facet arthrosis throughout the cervical and included upper thoracic spine. Advanced neural foraminal stenosis bilaterally at C3-4 and C4-5 and on the left at C5-6. Suspected moderate spinal stenosis at C3-4. Upper chest: Biapical pleuroparenchymal lung scarring. Other: Right thyroid nodules measuring up to 2 cm. IMPRESSION: 1. Acute to subacute right PCA infarct. 2. No acute cervical spine fracture. 3. Advanced cervical disc and  facet degeneration. 4. 2 cm incidental right thyroid nodule. Recommend non-emergent thyroid ultrasound if clinically warranted given patient age. Reference: J Am Coll Radiol. 2015 Feb;12(2): 143-50 Electronically Signed   By: Logan Bores M.D.   On: 07/01/2022 12:11   DG Chest Port 1 View  Result Date: 07/01/2022 CLINICAL DATA:  Weakness. EXAM: PORTABLE CHEST 1 VIEW COMPARISON:  09/25/2004 radiograph FINDINGS: The cardiomediastinal silhouette is unremarkable. Mild RIGHT basilar subsegmental atelectasis/scarring noted. There is no evidence of focal airspace disease, pulmonary edema, suspicious pulmonary nodule/mass, pleural effusion, or pneumothorax. No acute bony abnormalities are identified. IMPRESSION: Mild RIGHT basilar subsegmental atelectasis/scarring. No other acute abnormality. Electronically Signed   By: Margarette Canada M.D.   On: 07/01/2022 11:44    Scheduled Meds:  aspirin EC  81 mg Oral Daily   atorvastatin  20 mg Oral Daily   bisoprolol  5 mg Oral Daily   clopidogrel  75 mg Oral Daily   enoxaparin (LOVENOX) injection  40 mg Subcutaneous Q24H   mirtazapine  7.5 mg Oral QHS   multivitamin with minerals  1 tablet Oral Daily   Continuous Infusions:   LOS: 2 days    Time spent: 35 mins    Duard Brady, MD [facility]   If 7PM-7AM, please contact night-coverage

## 2022-07-04 MED ORDER — ASPIRIN 81 MG PO TBEC: 81.0000 mg | DELAYED_RELEASE_TABLET | Freq: Every day | ORAL | 12 refills | Status: AC

## 2022-07-04 MED ORDER — BISOPROLOL FUMARATE 5 MG PO TABS: 5.0000 mg | ORAL_TABLET | Freq: Every day | ORAL | 2 refills | Status: AC

## 2022-07-04 MED ORDER — ATORVASTATIN CALCIUM 20 MG PO TABS: 20.0000 mg | ORAL_TABLET | Freq: Every day | ORAL | 3 refills | Status: AC

## 2022-07-04 MED ORDER — CLOPIDOGREL BISULFATE 75 MG PO TABS: 75.0000 mg | ORAL_TABLET | Freq: Every day | ORAL | 3 refills | Status: AC

## 2022-07-04 NOTE — TOC Transition Note (Signed)
Transition of Care [facility]) - CM/SW Discharge Note   Patient Details  Name: [name] [name] MRN: [id number] Date of Birth: [birth year]  Transition of Care [facility]) CM/SW Contact:  Gerilyn Pilgrim, LCSW Phone Number: 07/04/2022, 10:43 AM   Clinical Narrative:   Pt has orders to discharge home with [facility]. Georgina Snell with [facility] notified. CSW signing off.           Patient Goals and CMS Choice      Discharge Placement                         Discharge Plan and Services Additional resources added to the After Visit Summary for                                       Social Determinants of Health (SDOH) Interventions SDOH Screenings   Food Insecurity: No Food Insecurity (07/01/2022)  Housing: Low Risk  (07/01/2022)  Transportation Needs: No Transportation Needs (07/01/2022)  Utilities: Not At Risk (07/01/2022)  Tobacco Use: Low Risk  (07/01/2022)     Readmission Risk Interventions     No data to display

## 2022-07-04 NOTE — Discharge Instructions (Signed)
Advised to follow-up with primary care physician in 1 week. Advised to follow-up with Neurology as scheduled. Advised to take Aspirin and Plavix for 90 days followed by aspirin monotherapy. Advised to take Lipitor 20 mg daily. Will request primary care physician to follow-up on thyroid nodule may require FNA C.

## 2022-07-04 NOTE — Discharge Summary (Signed)
Physician Discharge Summary  [name] [name] [id number] DOB: 1928-03-17 DOA: 07/01/2022  PCP: Ivor Costa, MD  Admit date: 07/01/2022  Discharge date: 07/04/2022  Admitted From: Home  Disposition:  [place].  Recommendations for Outpatient Follow-up:  Follow up with PCP in 1-2 weeks. Please obtain BMP/CBC in one week. Advised to follow-up with Neurology as scheduled. Advised to take Aspirin and Plavix for 90 days followed by aspirin monotherapy. Advised to take Lipitor 20 mg daily Will request primary care physician to follow-up on thyroid nodule,  may require FNA C.  Home Health: Home PT /OT / Aide/speech Equipment/Devices:None  Discharge Condition: Stable CODE STATUS: DNR Diet recommendation: Heart Healthy   Brief [facility] Hospital Course: This 89 years old female with PMH significant for hypertension, macular degeneration presented to the ED with complaints of dizziness, left leg weakness, decreased sensation in both hands, numbness in both feet. Daughter reports that patient started having nausea 3 days ago, She started complaining of dizziness, lightheadedness, weakness and has developed numbness in both hands yesterday. She subsequently fell. She denies any significant injury. Denies any loss of consciousness. She was hypertensive in the ED. Chest x-ray showed right basilar atelectasis. CT head and C-spine showed acute to subacute right PCA infarct, No acute cervical spine fracture. Neurology was consulted, Patient was admitted for stroke workup.  Patient did complete a stroke workup.  EEG no evidence of seizures.  MRI brain confirmed acute right PCA infarct intracranial stenosis.  Neurology recommended aspirin and Plavix for 3 months followed by aspirin monotherapy.  Advised to continue Lipitor 40 mg daily.  2D echocardiogram showed LVEF 60 to 65%, No source of any emboli.  PT and OT recommended home health services. Patient also found to have thyroid nodule on CT, thyroid  ultrasound shows right thyroid nodule, It was discussed with daughter about evaluation with FNAC as an outpatient.  She agreed.  Patient is being discharged home and  home health services arranged.  Discharge Diagnoses:  Principal Problem:   Stroke [facility]) Active Problems:   HTN (hypertension)   Depression   Fall at home, initial encounter   Thyroid nodule  Acute CVA: Patient presented with dizziness, left leg weakness, bilateral hand numbness. CT head showed acute to subacute right PCA infarct. Neurology is consulted. MRI brain confirmed acute right PCA infarct. CTA head and neck: No large vessel occlusion, widely patent carotid and vertebral arteries. Continue aspirin and Plavix for 90 days followed by aspirin only therapy. Neurology recommended DAPT for 90 days due to severe intracranial stenosis. Continue Lipitor 40 mg daily Patient is out of window for permissive hypertension. 2D echocardiogram shows LVEF 60 to 65%.  No intracardiac source of emboli PT and OT recommended SNF.  TOC working on finding placement. Patient has made improvement with physical therapy,  recommended home health services. EEG no evidence of seizures. Neurology signed off.  Patient is being discharged home.   Essential hypertension: Patient presented with BP 260/98 >172/84 Patient used to be on Zebeta which has been on hold recently due to low blood pressure. Restart Zebeta. Continue hydralazine IV as needed.   Major depression: Continue Remeron.   S/P fall at home: Continue Fall precautions Continue PT and OT eval.   Thyroid nodule: Incidental finding Recommended outpatient follow-up.  Discharge Instructions  Discharge Instructions     Ambulatory referral to Neurology   Complete by: As directed    Call MD for:  difficulty breathing, headache or visual disturbances   Complete by: As  directed    Call MD for:  persistant dizziness or light-headedness   Complete by: As directed    Call MD  for:  persistant nausea and vomiting   Complete by: As directed    Diet - low sodium heart healthy   Complete by: As directed    Diet Carb Modified   Complete by: As directed    Discharge instructions   Complete by: As directed    Advised to follow-up with primary care physician in 1 week. Advised to follow-up with neurology as scheduled. Advised to take aspirin and Plavix for 90 days followed by aspirin monotherapy. Advised to take Lipitor 20 mg daily Will request primary care physician to follow-up on thyroid nodule may require FNA C.   Increase activity slowly   Complete by: As directed       Allergies as of 07/04/2022   No Known Allergies      Medication List     STOP taking these medications    Melatonin 1 MG Chew       TAKE these medications    Acetaminophen 500 MG capsule Take 1 tablet by mouth every 4 (four) hours as needed.   aspirin EC 81 MG tablet Take 1 tablet (81 mg total) by mouth daily. Swallow whole. Start taking on: July 05, 2022   atorvastatin 20 MG tablet Commonly known as: LIPITOR Take 1 tablet (20 mg total) by mouth daily. Start taking on: July 05, 2022   bisoprolol 5 MG tablet Commonly known as: ZEBETA Take 1 tablet (5 mg total) by mouth daily. Start taking on: July 05, 2022   clopidogrel 75 MG tablet Commonly known as: PLAVIX Take 1 tablet (75 mg total) by mouth daily. Start taking on: July 05, 2022   hydroxypropyl methylcellulose / hypromellose 2.5 % ophthalmic solution Commonly known as: ISOPTO TEARS / GONIOVISC 1 drop as needed for dry eyes.   mirtazapine 7.5 MG tablet Commonly known as: REMERON Take 7.5 mg by mouth at bedtime.   PRESERVISION AREDS 2 PO Take 1 tablet by mouth 2 (two) times daily.        Follow-up Information     Ivor Costa, MD Follow up in 1 week(s).   Specialty: Internal Medicine Why: Family need to make follow up appt with patients PCP Contact information: [place] [address] [phone]         [facility]. Follow up in 1 week(s).   Why: Referral has been sent to [place] information: [address] [place] [address] [phone]                No Known Allergies  Consultations: Neurology   Procedures/Studies: EEG adult  Result Date: Jul 15, 2022 Derek Jack, MD     07-15-2022  7:10 PM Routine EEG Report RAYELYN SCHEIBLE is a 89 y.o. female with a history of altered mental status who is undergoing an EEG to evaluate for seizures. Report: This EEG was acquired with electrodes placed according to the International 10-20 electrode system (including Fp1, Fp2, F3, F4, C3, C4, P3, P4, O1, O2, T3, T4, T5, T6, A1, A2, Fz, Cz, Pz). The following electrodes were missing or displaced: none. The occipital dominant rhythm was 6-7 Hz. This activity is reactive to stimulation. Drowsiness was manifested by background fragmentation; deeper stages of sleep were identified by K complexes and sleep spindles. There was no focal slowing. There were no interictal epileptiform discharges.  There were no electrographic seizures identified. Photic stimulation and hyperventilation were not performed. Impression and clinical correlation: This EEG was obtained while awake and asleep and is abnormal due to mild diffuse slowing indicative of global cerebral dysfunction. Epileptiform abnormalities were not seen during this recording. Su Monks, MD [facility] [phone] If 7pm- 7am, please page neurology on call as listed in [place].   ECHOCARDIOGRAM COMPLETE  Result Date: 07/02/2022    ECHOCARDIOGRAM REPORT   Patient Name:   NARISSA CREVELING Date of Exam: 07/02/2022 Medical Rec #:  [id number]      Height:       62.0 in Accession #:    [id number]     Weight:       115.0 lb Date of Birth:  1927-09-10       BSA:          1.511 m Patient Age:    89 years       BP:           171/82 mmHg Patient Gender: F              HR:            73 bpm. Exam Location:  [facility] Procedure: 2D Echo, Cardiac Doppler and Color Doppler Indications:     Stroke  History:         Patient has no prior history of Echocardiogram examinations.                  Stroke; Risk Factors:Hypertension.  Sonographer:     Wenda Low Referring Phys:  [place] Diagnosing Phys: Kathlyn Sacramento MD IMPRESSIONS  1. Left ventricular ejection fraction, by estimation, is 60 to 65%. The left ventricle has normal function. The left ventricle has no regional wall motion abnormalities. There is moderate asymmetric left ventricular hypertrophy of the basal-septal segment. Left ventricular diastolic parameters are consistent with Grade I diastolic dysfunction (impaired relaxation).  2. Right ventricular systolic function is normal. The right ventricular size is normal. There is normal pulmonary artery systolic pressure.  3. Right atrial size was mildly dilated.  4. The mitral valve is abnormal. Mild to moderate mitral valve regurgitation. No evidence of mitral stenosis. Moderate mitral annular calcification.  5. Tricuspid valve regurgitation is moderate.  6. The aortic valve is normal in structure. Aortic valve regurgitation is not visualized. Aortic valve sclerosis/calcification is present, without any evidence of aortic stenosis.  7. The inferior vena cava is normal in size with greater than 50% respiratory variability, suggesting right atrial pressure of 3 mmHg. FINDINGS  Left Ventricle: Left ventricular ejection fraction, by estimation, is 60 to 65%. The left ventricle has normal function. The left ventricle has no regional wall motion abnormalities. The left ventricular internal cavity size was normal in size. There is  moderate asymmetric left ventricular hypertrophy of the basal-septal segment. Left ventricular diastolic parameters are consistent with Grade I diastolic dysfunction (impaired relaxation). Right Ventricle: The right ventricular size is normal. No increase in  right ventricular wall thickness. Right ventricular systolic function is normal. There is normal pulmonary artery systolic pressure. The tricuspid regurgitant velocity is 2.55 m/s, and  with an assumed right atrial pressure of 3 mmHg, the estimated right ventricular systolic pressure is 0000000 mmHg. Left Atrium: Left atrial size was normal in size. Right Atrium: Right atrial size was mildly dilated. Pericardium: There is no evidence of pericardial effusion. Mitral Valve: The mitral valve is abnormal. There is moderate thickening of the mitral  valve leaflet(s). There is moderate calcification of the mitral valve leaflet(s). Moderate mitral annular calcification. Mild to moderate mitral valve regurgitation. No evidence of mitral valve stenosis. MV peak gradient, 6.1 mmHg. The mean mitral valve gradient is 2.0 mmHg. Tricuspid Valve: The tricuspid valve is normal in structure. Tricuspid valve regurgitation is moderate . No evidence of tricuspid stenosis. Aortic Valve: The aortic valve is normal in structure. Aortic valve regurgitation is not visualized. Aortic valve sclerosis/calcification is present, without any evidence of aortic stenosis. Aortic valve mean gradient measures 3.0 mmHg. Aortic valve peak  gradient measures 6.0 mmHg. Aortic valve area, by VTI measures 2.39 cm. Pulmonic Valve: The pulmonic valve was normal in structure. Pulmonic valve regurgitation is not visualized. No evidence of pulmonic stenosis. Aorta: The aortic root is normal in size and structure. Venous: The inferior vena cava is normal in size with greater than 50% respiratory variability, suggesting right atrial pressure of 3 mmHg. IAS/Shunts: No atrial level shunt detected by color flow Doppler.  LEFT VENTRICLE PLAX 2D LVIDd:         4.60 cm   Diastology LVIDs:         2.70 cm   LV e' medial:    4.57 cm/s LV PW:         1.10 cm   LV E/e' medial:  14.8 LV IVS:        1.10 cm   LV e' lateral:   5.33 cm/s LVOT diam:     1.80 cm   LV E/e' lateral:  12.7 LV SV:         59 LV SV Index:   39 LVOT Area:     2.54 cm  RIGHT VENTRICLE RV Basal diam:  3.15 cm RV Mid diam:    2.50 cm RV S prime:     10.70 cm/s TAPSE (M-mode): 2.2 cm LEFT ATRIUM             Index        RIGHT ATRIUM           Index LA diam:        3.60 cm 2.38 cm/m   RA Area:     18.90 cm LA Vol (A2C):   54.3 ml 35.94 ml/m  RA Volume:   51.00 ml  33.75 ml/m LA Vol (A4C):   41.1 ml 27.20 ml/m LA Biplane Vol: 49.4 ml 32.69 ml/m  AORTIC VALVE                    PULMONIC VALVE AV Area (Vmax):    2.34 cm     PV Vmax:       0.80 m/s AV Area (Vmean):   2.28 cm     PV Peak grad:  2.5 mmHg AV Area (VTI):     2.39 cm AV Vmax:           122.00 cm/s AV Vmean:          81.000 cm/s AV VTI:            0.247 m AV Peak Grad:      6.0 mmHg AV Mean Grad:      3.0 mmHg LVOT Vmax:         112.00 cm/s LVOT Vmean:        72.500 cm/s LVOT VTI:          0.232 m LVOT/AV VTI ratio: 0.94  AORTA Ao Root diam: 3.00 cm MITRAL VALVE  TRICUSPID VALVE MV Area (PHT): 2.79 cm     TR Peak grad:   26.0 mmHg MV Area VTI:   1.90 cm     TR Vmax:        255.00 cm/s MV Peak grad:  6.1 mmHg MV Mean grad:  2.0 mmHg     SHUNTS MV Vmax:       1.23 m/s     Systemic VTI:  0.23 m MV Vmean:      63.9 cm/s    Systemic Diam: 1.80 cm MV Decel Time: 272 msec MV E velocity: 67.60 cm/s MV A velocity: 103.00 cm/s MV E/A ratio:  0.66 Kathlyn Sacramento MD Electronically signed by Kathlyn Sacramento MD Signature Date/Time: 07/02/2022/4:15:01 PM    Final    US THYROID  Result Date: 07/02/2022 CLINICAL DATA:  Incidental on CT. EXAM: THYROID ULTRASOUND TECHNIQUE: Ultrasound examination of the thyroid gland and adjacent soft tissues was performed. COMPARISON:  None Available. FINDINGS: Parenchymal Echotexture: Moderately heterogenous Isthmus: 0.3 cm Right lobe: 4.6 x 3.0 x 1.4 cm Left lobe: 3.2 x 1.5 x 1.1 cm _________________________________________________________ Estimated total number of nodules >/= 1 cm: 3 Number of spongiform nodules >/=   2 cm not described below (TR1): 0 Number of mixed cystic and solid nodules >/= 1.5 cm not described below (TR2): 0 _________________________________________________________ Nodule # 1: Location: Right; Mid Maximum size: 2.2 cm; Other 2 dimensions: 2.1 x 1.6 cm Composition: solid/almost completely solid (2) Echogenicity: isoechoic (1) Shape: taller-than-wide (3) Margins: ill-defined (0) Echogenic foci: none (0) ACR TI-RADS total points: 6. ACR TI-RADS risk category: TR4 (4-6 points). ACR TI-RADS recommendations: **Given size (>/= 1.5 cm) and appearance, fine needle aspiration of this moderately suspicious nodule should be considered based on TI-RADS criteria. _________________________________________________________ Nodule # 2: Ill-defined grossly 1.4 cm isoechoic solid nodule in the right lower gland. This either represents a pseudo nodule, or a TI-RADS category 3 nodule. In either case, it does not meet criteria to warrant further imaging follow-up. Nodule # 3: 1 cm spongiform nodule in the left mid gland. TI-RADS category 2. This nodule does NOT meet TI-RADS criteria for biopsy or dedicated follow-up. IMPRESSION: 1. Approximately 2.2 cm TI-RADS category 4 nodule in the right mid gland meets criteria to consider fine-needle aspiration biopsy. Given relatively advanced age, imaging surveillance could also be considered. 2. Other small visualized thyroid nodule/pseudo nodules do not meet criteria to warrant further evaluation. The above is in keeping with the ACR TI-RADS recommendations - J Am Coll Radiol 2017;14:587-595. Electronically Signed   By: Jacqulynn Cadet M.D.   On: 07/02/2022 05:55   CT VENOGRAM HEAD  Result Date: 07/01/2022 CLINICAL DATA:  Dural venous sinus thrombosis suspected. EXAM: CT VENOGRAM HEAD TECHNIQUE: Venographic phase images of the brain were obtained following the administration of intravenous contrast. Multiplanar reformats and maximum intensity projections were generated. RADIATION  DOSE REDUCTION: This exam was performed according to the departmental dose-optimization program which includes automated exposure control, adjustment of the mA and/or kV according to patient size and/or use of iterative reconstruction technique. CONTRAST:  3m OMNIPAQUE IOHEXOL 350 MG/ML SOLN COMPARISON:  None Available. FINDINGS: The superior sagittal sinus, internal cerebral veins, vein of Galen, straight sinus, transverse sinuses, sigmoid sinuses, and jugular bulbs are patent without evidence of thrombus or significant stenosis. IMPRESSION: No evidence of dural venous sinus thrombosis. Electronically Signed   By: ALogan BoresM.D.   On: 07/01/2022 18:45   CT ANGIO HEAD NECK W WO CM  Result Date: 07/01/2022 CLINICAL  DATA:  Neuro deficit, acute, stroke suspected. Acute right PCA infarct. EXAM: CT ANGIOGRAPHY HEAD AND NECK TECHNIQUE: Multidetector CT imaging of the head and neck was performed using the standard protocol during bolus administration of intravenous contrast. Multiplanar CT image reconstructions and MIPs were obtained to evaluate the vascular anatomy. Carotid stenosis measurements (when applicable) are obtained utilizing NASCET criteria, using the distal internal carotid diameter as the denominator. RADIATION DOSE REDUCTION: This exam was performed according to the departmental dose-optimization program which includes automated exposure control, adjustment of the mA and/or kV according to patient size and/or use of iterative reconstruction technique. CONTRAST:  59m OMNIPAQUE IOHEXOL 350 MG/ML SOLN COMPARISON:  None Available. FINDINGS: CTA NECK FINDINGS Aortic arch: Standard 3 vessel aortic arch with mild atherosclerotic plaque. Widely patent brachiocephalic and subclavian arteries. Right carotid system: Patent with a small amount of calcified plaque at the carotid bifurcation. No evidence of a significant stenosis or dissection. Left carotid system: Patent with a small amount of calcified plaque  at the carotid bifurcation. No evidence of a significant stenosis or dissection. Vertebral arteries: Patent without evidence of stenosis, dissection, or significant atherosclerosis. Mildly dominant left vertebral artery. Skeleton: Widespread advanced facet arthrosis and disc degeneration in the cervical spine with mild multilevel degenerative listhesis. Other neck: Multiple right thyroid nodules measuring up to approximately 2 cm. No evidence of cervical lymphadenopathy. Upper chest: Biapical pleuroparenchymal lung scarring. Review of the MIP images confirms the above findings CTA HEAD FINDINGS Anterior circulation: The internal carotid arteries are patent from skull base to carotid termini with mild atherosclerosis bilaterally not resulting in significant stenosis. ACAs and MCAs are patent without evidence of a significant A1 or M1 stenosis. There is moderate MCA branch vessel atherosclerosis bilaterally, and there are severe left A2 and bilateral A3 stenoses. No aneurysm identified. Posterior circulation: Intracranial vertebral arteries are patent to the basilar with mild atheromatous irregularity but no significant stenosis. Patent PICA, AICA, and SCA origins are seen bilaterally. The basilar artery is widely patent. Posterior communicating arteries are diminutive or absent. The PCAs are patent with a severe, near occlusive stenosis of the distal right P2 segment and with were distal branch vessel irregularity noted bilaterally. No aneurysm is identified. Venous sinuses: More fully evaluated on the separate venogram. Anatomic variants: None. Review of the MIP images confirms the above findings IMPRESSION: 1. No emergent large vessel occlusion. 2. Intracranial atherosclerosis including severe distal right P2 and left A2 stenoses. 3. Widely patent cervical carotid and vertebral arteries. 4. 2 cm right thyroid nodule. Recommend non-emergent thyroid ultrasound if clinically warranted given patient age. Reference: J  Am Coll Radiol. 2015 Feb;12(2): 143-50 5.  Aortic Atherosclerosis (ICD10-I70.0). Electronically Signed   By: ALogan BoresM.D.   On: 07/01/2022 18:44   MR BRAIN WO CONTRAST  Result Date: 07/01/2022 CLINICAL DATA:  Neuro deficit, acute, stroke suspected. Right PCA infarct on CT. EXAM: MRI HEAD WITHOUT CONTRAST TECHNIQUE: Multiplanar, multiecho pulse sequences of the brain and surrounding structures were obtained without intravenous contrast. COMPARISON:  Head CT 07/01/2022 FINDINGS: Brain: There is an acute right PCA infarct involving portions of the occipital lobe, posterior temporal lobe including hippocampal tail, splenium of the corpus callosum, and lateral right thalamus. A single focus of chronic microhemorrhage is noted in the right parietal periventricular white matter. T2 hyperintensities elsewhere in the cerebral white matter bilaterally are nonspecific but compatible with mild chronic small vessel ischemic disease. There is generalized cerebral atrophy. No mass, midline shift, or extra-axial fluid collection is identified.  Vascular: Major intracranial vascular flow voids are preserved. Skull and upper cervical spine: Unremarkable bone marrow signal. Sinuses/Orbits: Bilateral cataract extraction. Paranasal sinuses and mastoid air cells are clear. Other: None. IMPRESSION: 1. Acute right PCA infarct. 2. Mild chronic small vessel ischemic disease. Electronically Signed   By: Logan Bores M.D.   On: 07/01/2022 18:04   CT Head Wo Contrast  Result Date: 07/01/2022 CLINICAL DATA:  Head trauma, minor (Age >= 65y); Neck trauma (Age >= 65y). Fall yesterday. EXAM: CT HEAD WITHOUT CONTRAST CT CERVICAL SPINE WITHOUT CONTRAST TECHNIQUE: Multidetector CT imaging of the head and cervical spine was performed following the standard protocol without intravenous contrast. Multiplanar CT image reconstructions of the cervical spine were also generated. RADIATION DOSE REDUCTION: This exam was performed according to the  departmental dose-optimization program which includes automated exposure control, adjustment of the mA and/or kV according to patient size and/or use of iterative reconstruction technique. COMPARISON:  None Available. FINDINGS: CT HEAD FINDINGS Brain: A 3 cm region of confluent hypodensity involving cortex and white matter in the right occipital lobe is consistent with an acute to subacute PCA infarct. No intracranial mass effect, acute intracranial hemorrhage, or extra-axial fluid collection is identified. Hypodensities elsewhere in the cerebral white matter bilaterally are nonspecific but compatible with mild chronic small vessel ischemic disease. Cerebral atrophy is mild for age. Vascular: Calcified atherosclerosis at the skull base. Small calcification at the basilar tip. Skull: No acute fracture or suspicious osseous lesion. Sinuses/Orbits: Visualized paranasal sinuses and mastoid air cells are clear. Bilateral cataract extraction. Other: None. CT CERVICAL SPINE FINDINGS Alignment: Reversal of the normal cervical lordosis. Grade 1 anterolisthesis of C3 on C4, C4 on C5, C5 on C6, C7 on T1, T1 on T2, and T2 on T3. Mild right convex curvature of the cervical spine. Skull base and vertebrae: No acute fracture or suspicious osseous lesion. Asymmetrically advanced left-sided C1-2 arthropathy. Interbody and facet ankylosis at C6-7. Bridging anterior vertebral osteophytes at C7-T1 and T1-2. Soft tissues and spinal canal: No prevertebral fluid or swelling. No visible canal hematoma. Disc levels: Widespread disc degeneration, most advanced at C5-6. Widespread severe facet arthrosis throughout the cervical and included upper thoracic spine. Advanced neural foraminal stenosis bilaterally at C3-4 and C4-5 and on the left at C5-6. Suspected moderate spinal stenosis at C3-4. Upper chest: Biapical pleuroparenchymal lung scarring. Other: Right thyroid nodules measuring up to 2 cm. IMPRESSION: 1. Acute to subacute right PCA  infarct. 2. No acute cervical spine fracture. 3. Advanced cervical disc and facet degeneration. 4. 2 cm incidental right thyroid nodule. Recommend non-emergent thyroid ultrasound if clinically warranted given patient age. Reference: J Am Coll Radiol. 2015 Feb;12(2): 143-50 Electronically Signed   By: Logan Bores M.D.   On: 07/01/2022 12:11   CT Cervical Spine Wo Contrast  Result Date: 07/01/2022 CLINICAL DATA:  Head trauma, minor (Age >= 65y); Neck trauma (Age >= 65y). Fall yesterday. EXAM: CT HEAD WITHOUT CONTRAST CT CERVICAL SPINE WITHOUT CONTRAST TECHNIQUE: Multidetector CT imaging of the head and cervical spine was performed following the standard protocol without intravenous contrast. Multiplanar CT image reconstructions of the cervical spine were also generated. RADIATION DOSE REDUCTION: This exam was performed according to the departmental dose-optimization program which includes automated exposure control, adjustment of the mA and/or kV according to patient size and/or use of iterative reconstruction technique. COMPARISON:  None Available. FINDINGS: CT HEAD FINDINGS Brain: A 3 cm region of confluent hypodensity involving cortex and white matter in the right occipital lobe is  consistent with an acute to subacute PCA infarct. No intracranial mass effect, acute intracranial hemorrhage, or extra-axial fluid collection is identified. Hypodensities elsewhere in the cerebral white matter bilaterally are nonspecific but compatible with mild chronic small vessel ischemic disease. Cerebral atrophy is mild for age. Vascular: Calcified atherosclerosis at the skull base. Small calcification at the basilar tip. Skull: No acute fracture or suspicious osseous lesion. Sinuses/Orbits: Visualized paranasal sinuses and mastoid air cells are clear. Bilateral cataract extraction. Other: None. CT CERVICAL SPINE FINDINGS Alignment: Reversal of the normal cervical lordosis. Grade 1 anterolisthesis of C3 on C4, C4 on C5, C5 on  C6, C7 on T1, T1 on T2, and T2 on T3. Mild right convex curvature of the cervical spine. Skull base and vertebrae: No acute fracture or suspicious osseous lesion. Asymmetrically advanced left-sided C1-2 arthropathy. Interbody and facet ankylosis at C6-7. Bridging anterior vertebral osteophytes at C7-T1 and T1-2. Soft tissues and spinal canal: No prevertebral fluid or swelling. No visible canal hematoma. Disc levels: Widespread disc degeneration, most advanced at C5-6. Widespread severe facet arthrosis throughout the cervical and included upper thoracic spine. Advanced neural foraminal stenosis bilaterally at C3-4 and C4-5 and on the left at C5-6. Suspected moderate spinal stenosis at C3-4. Upper chest: Biapical pleuroparenchymal lung scarring. Other: Right thyroid nodules measuring up to 2 cm. IMPRESSION: 1. Acute to subacute right PCA infarct. 2. No acute cervical spine fracture. 3. Advanced cervical disc and facet degeneration. 4. 2 cm incidental right thyroid nodule. Recommend non-emergent thyroid ultrasound if clinically warranted given patient age. Reference: J Am Coll Radiol. 2015 Feb;12(2): 143-50 Electronically Signed   By: Logan Bores M.D.   On: 07/01/2022 12:11   DG Chest Port 1 View  Result Date: 07/01/2022 CLINICAL DATA:  Weakness. EXAM: PORTABLE CHEST 1 VIEW COMPARISON:  09/25/2004 radiograph FINDINGS: The cardiomediastinal silhouette is unremarkable. Mild RIGHT basilar subsegmental atelectasis/scarring noted. There is no evidence of focal airspace disease, pulmonary edema, suspicious pulmonary nodule/mass, pleural effusion, or pneumothorax. No acute bony abnormalities are identified. IMPRESSION: Mild RIGHT basilar subsegmental atelectasis/scarring. No other acute abnormality. Electronically Signed   By: Margarette Canada M.D.   On: 07/01/2022 11:44     Subjective: Patient seen and examined at bedside.  Overnight events noted.   Patient reports doing better.  Cleared from neurology to be discharged.   Patient being discharged home.  Discharge Exam: Vitals:   07/04/22 0516 07/04/22 0803  BP: 138/64 139/67  Pulse: 94 93  Resp: 19 18  Temp: 98.2 F (36.8 C) 98.5 F (36.9 C)  SpO2: 96% 95%   Vitals:   07/03/22 1848 07/03/22 2008 07/04/22 0516 07/04/22 0803  BP: (!) 160/80 (!) 161/71 138/64 139/67  Pulse: 72 83 94 93  Resp:  '18 19 18  '$ Temp:  98.4 F (36.9 C) 98.2 F (36.8 C) 98.5 F (36.9 C)  TempSrc:   Oral Oral  SpO2:  99% 96% 95%  Weight:      Height:        General: Pt is alert, awake, not in acute distress Cardiovascular: RRR, S1/S2 +, no rubs, no gallops Respiratory: CTA bilaterally, no wheezing, no rhonchi Abdominal: Soft, NT, ND, bowel sounds + Extremities: no edema, no cyanosis    The results of significant diagnostics from this hospitalization (including imaging, microbiology, ancillary and laboratory) are listed below for reference.     Microbiology: Recent Results (from the past 240 hour(s))  Resp panel by RT-PCR (RSV, Flu A&B, Covid) Anterior Nasal Swab  Status: None   Collection Time: 07/01/22 11:38 AM   Specimen: Anterior Nasal Swab  Result Value Ref Range Status   SARS Coronavirus 2 by RT PCR NEGATIVE NEGATIVE Final    Comment: (NOTE) SARS-CoV-2 target nucleic acids are NOT DETECTED.  The SARS-CoV-2 RNA is generally detectable in upper respiratory specimens during the acute phase of infection. The lowest concentration of SARS-CoV-2 viral copies this assay can detect is 138 copies/mL. A negative result does not preclude SARS-Cov-2 infection and should not be used as the sole basis for treatment or other patient management decisions. A negative result may occur with  improper specimen collection/handling, submission of specimen other than nasopharyngeal swab, presence of viral mutation(s) within the areas targeted by this assay, and inadequate number of viral copies(<138 copies/mL). A negative result must be combined with clinical  observations, patient history, and epidemiological information. The expected result is Negative.  Fact Sheet for Patients:  EntrepreneurPulse.com.au  Fact Sheet for Healthcare Providers:  IncredibleEmployment.be  This test is no t yet approved or cleared by the [place] FDA and  has been authorized for detection and/or diagnosis of SARS-CoV-2 by FDA under an Emergency Use Authorization (EUA). This EUA will remain  in effect (meaning this test can be used) for the duration of the COVID-19 declaration under Section 564(b)(1) of the Act, 21 U.S.C.section 360bbb-3(b)(1), unless the authorization is terminated  or revoked sooner.       Influenza A by PCR NEGATIVE NEGATIVE Final   Influenza B by PCR NEGATIVE NEGATIVE Final    Comment: (NOTE) The Xpert Xpress SARS-CoV-2/FLU/RSV plus assay is intended as an aid in the diagnosis of influenza from Nasopharyngeal swab specimens and should not be used as a sole basis for treatment. Nasal washings and aspirates are unacceptable for Xpert Xpress SARS-CoV-2/FLU/RSV testing.  Fact Sheet for Patients: EntrepreneurPulse.com.au  Fact Sheet for Healthcare Providers: IncredibleEmployment.be  This test is not yet approved or cleared by the [place] FDA and has been authorized for detection and/or diagnosis of SARS-CoV-2 by FDA under an Emergency Use Authorization (EUA). This EUA will remain in effect (meaning this test can be used) for the duration of the COVID-19 declaration under Section 564(b)(1) of the Act, 21 U.S.C. section 360bbb-3(b)(1), unless the authorization is terminated or revoked.     Resp Syncytial Virus by PCR NEGATIVE NEGATIVE Final    Comment: (NOTE) Fact Sheet for Patients: EntrepreneurPulse.com.au  Fact Sheet for Healthcare Providers: IncredibleEmployment.be  This test is not yet approved or cleared by  the [place] FDA and has been authorized for detection and/or diagnosis of SARS-CoV-2 by FDA under an Emergency Use Authorization (EUA). This EUA will remain in effect (meaning this test can be used) for the duration of the COVID-19 declaration under Section 564(b)(1) of the Act, 21 U.S.C. section 360bbb-3(b)(1), unless the authorization is terminated or revoked.  Performed at [facility], [address] [place], [place] [address]      Labs: BNP (last 3 results) No results for input(s): "BNP" in the last 8760 hours. Basic Metabolic Panel: Recent Labs  Lab 07/01/22 1138 07/03/22 0434  NA 140 139  K 3.7 3.6  CL 105 104  CO2 24 27  GLUCOSE 100* 98  BUN 13 20  CREATININE 0.74 0.76  CALCIUM 9.2 9.1  MG 2.2 2.2  PHOS  --  4.2   Liver Function Tests: Recent Labs  Lab 07/01/22 1138  AST 24  ALT 14  ALKPHOS 71  BILITOT 0.7  PROT  6.6  ALBUMIN 4.1   No results for input(s): "LIPASE", "AMYLASE" in the last 168 hours. No results for input(s): "AMMONIA" in the last 168 hours. CBC: Recent Labs  Lab 07/01/22 1138 07/03/22 0434  WBC 4.2 6.4  NEUTROABS 2.5  --   HGB 13.5 13.1  HCT 41.5 39.4  MCV 93.0 92.1  PLT 139* 123*   Cardiac Enzymes: No results for input(s): "CKTOTAL", "CKMB", "CKMBINDEX", "TROPONINI" in the last 168 hours. BNP: Invalid input(s): "POCBNP" CBG: Recent Labs  Lab 07/01/22 2055 07/02/22 0753  GLUCAP 156* 102*   D-Dimer No results for input(s): "DDIMER" in the last 72 hours. Hgb A1c No results for input(s): "HGBA1C" in the last 72 hours.  Lipid Profile Recent Labs    07/02/22 0508  CHOL 148  HDL 60  LDLCALC 71  TRIG 86  CHOLHDL 2.5   Thyroid function studies Recent Labs    07/01/22 1444 07/02/22 0508  TSH 1.619  --   T3FREE  --  2.9   Anemia work up No results for input(s): "VITAMINB12", "FOLATE", "FERRITIN", "TIBC", "IRON", "RETICCTPCT" in the last 72 hours. Urinalysis    Component Value Date/Time    COLORURINE STRAW (A) 07/01/2022 0305   APPEARANCEUR CLEAR (A) 07/01/2022 0305   LABSPEC 1.014 07/01/2022 0305   PHURINE 7.0 07/01/2022 0305   GLUCOSEU NEGATIVE 07/01/2022 0305   HGBUR NEGATIVE 07/01/2022 0305   BILIRUBINUR NEGATIVE 07/01/2022 0305   KETONESUR NEGATIVE 07/01/2022 0305   PROTEINUR NEGATIVE 07/01/2022 0305   NITRITE NEGATIVE 07/01/2022 0305   LEUKOCYTESUR SMALL (A) 07/01/2022 0305   Sepsis Labs Recent Labs  Lab 07/01/22 1138 07/03/22 0434  WBC 4.2 6.4   Microbiology Recent Results (from the past 240 hour(s))  Resp panel by RT-PCR (RSV, Flu A&B, Covid) Anterior Nasal Swab     Status: None   Collection Time: 07/01/22 11:38 AM   Specimen: Anterior Nasal Swab  Result Value Ref Range Status   SARS Coronavirus 2 by RT PCR NEGATIVE NEGATIVE Final    Comment: (NOTE) SARS-CoV-2 target nucleic acids are NOT DETECTED.  The SARS-CoV-2 RNA is generally detectable in upper respiratory specimens during the acute phase of infection. The lowest concentration of SARS-CoV-2 viral copies this assay can detect is 138 copies/mL. A negative result does not preclude SARS-Cov-2 infection and should not be used as the sole basis for treatment or other patient management decisions. A negative result may occur with  improper specimen collection/handling, submission of specimen other than nasopharyngeal swab, presence of viral mutation(s) within the areas targeted by this assay, and inadequate number of viral copies(<138 copies/mL). A negative result must be combined with clinical observations, patient history, and epidemiological information. The expected result is Negative.  Fact Sheet for Patients:  EntrepreneurPulse.com.au  Fact Sheet for Healthcare Providers:  IncredibleEmployment.be  This test is no t yet approved or cleared by the [place] FDA and  has been authorized for detection and/or diagnosis of SARS-CoV-2 by FDA under an  Emergency Use Authorization (EUA). This EUA will remain  in effect (meaning this test can be used) for the duration of the COVID-19 declaration under Section 564(b)(1) of the Act, 21 U.S.C.section 360bbb-3(b)(1), unless the authorization is terminated  or revoked sooner.       Influenza A by PCR NEGATIVE NEGATIVE Final   Influenza B by PCR NEGATIVE NEGATIVE Final    Comment: (NOTE) The Xpert Xpress SARS-CoV-2/FLU/RSV plus assay is intended as an aid in the diagnosis of influenza from Nasopharyngeal swab specimens and  should not be used as a sole basis for treatment. Nasal washings and aspirates are unacceptable for Xpert Xpress SARS-CoV-2/FLU/RSV testing.  Fact Sheet for Patients: EntrepreneurPulse.com.au  Fact Sheet for Healthcare Providers: IncredibleEmployment.be  This test is not yet approved or cleared by the [place] FDA and has been authorized for detection and/or diagnosis of SARS-CoV-2 by FDA under an Emergency Use Authorization (EUA). This EUA will remain in effect (meaning this test can be used) for the duration of the COVID-19 declaration under Section 564(b)(1) of the Act, 21 U.S.C. section 360bbb-3(b)(1), unless the authorization is terminated or revoked.     Resp Syncytial Virus by PCR NEGATIVE NEGATIVE Final    Comment: (NOTE) Fact Sheet for Patients: EntrepreneurPulse.com.au  Fact Sheet for Healthcare Providers: IncredibleEmployment.be  This test is not yet approved or cleared by the [place] FDA and has been authorized for detection and/or diagnosis of SARS-CoV-2 by FDA under an Emergency Use Authorization (EUA). This EUA will remain in effect (meaning this test can be used) for the duration of the COVID-19 declaration under Section 564(b)(1) of the Act, 21 U.S.C. section 360bbb-3(b)(1), unless the authorization is terminated or revoked.  Performed at [facility], [address] [place], [place] [address]      Time coordinating discharge: Over 30 minutes  SIGNED:   Duard Brady, MD  [facility] 07/04/2022, 12:10 PM Pager   If 7PM-7AM, please contact night-coverage

## 2022-07-04 NOTE — Care Management Important Message (Signed)
Important Message  Patient Details  Name: RENEZMEE CLIMER MRN: [id number] Date of Birth: 27-Oct-1927   Medicare Important Message Given:  Yes     Dannette Barbara 07/04/2022, 11:30 AM

## 2022-07-04 NOTE — Progress Notes (Signed)
OT Cancellation Note  Patient Details Name: [name] [name] MRN: [id number] DOB: 09/05/1927   Cancelled Treatment:    Reason Eval/Treat Not Completed: Other (comment). RN present in room upon arrival getting pt settled in recliner. Pt reported breakfast tray was just delivered and requested OT come back later today. Will re-attempt as able.   [name] 07/04/2022, 8:44 AM

## 2023-04-06 ENCOUNTER — Telehealth: Payer: Medicare HMO | Admitting: Emergency Medicine

## 2023-04-06 DIAGNOSIS — J189 Pneumonia, unspecified organism: Secondary | ICD-10-CM

## 2023-04-06 MED ORDER — ALBUTEROL SULFATE HFA 108 (90 BASE) MCG/ACT IN AERS: 2.0000 | INHALATION_SPRAY | Freq: Four times a day (QID) | RESPIRATORY_TRACT | 0 refills | Status: AC | PRN

## 2023-04-06 MED ORDER — SPACER/AERO-HOLDING CHAMBERS DEVI: 1.0000 | 0 refills | Status: AC | PRN

## 2023-04-06 MED ORDER — AZITHROMYCIN 250 MG PO TABS: ORAL_TABLET | ORAL | 0 refills | Status: AC

## 2023-04-06 NOTE — Patient Instructions (Signed)
[name] [name], thank you for joining Cathlyn Parsons, NP for today's virtual visit.  While this provider is not your primary care provider (PCP), if your PCP is located in our provider database this encounter information will be shared with them immediately following your visit.   A [facility] MyChart account gives you access to today's visit and all your visits, tests, and labs performed at [facility] " click here if you don't have a [facility] MyChart account or go to [email or web address]  Consent: (Patient) [name] [name] provided verbal consent for this virtual visit at the beginning of the encounter.  Current Medications:  Current Outpatient Medications:    albuterol (VENTOLIN HFA) 108 (90 Base) MCG/ACT inhaler, Inhale 2 puffs into the lungs every 6 (six) hours as needed for wheezing or shortness of breath., Disp: 8 g, Rfl: 0   azithromycin (ZITHROMAX) 250 MG tablet, Take 2 tablets on day 1, then 1 tablet daily on days 2 through 5, Disp: 6 tablet, Rfl: 0   Spacer/Aero-Holding Chambers DEVI, 1 each by Does not apply route as needed., Disp: 1 each, Rfl: 0   Acetaminophen 500 MG capsule, Take 1 tablet by mouth every 4 (four) hours as needed., Disp: , Rfl:    aspirin EC 81 MG tablet, Take 1 tablet (81 mg total) by mouth daily. Swallow whole., Disp: 30 tablet, Rfl: 12   atorvastatin (LIPITOR) 20 MG tablet, Take 1 tablet (20 mg total) by mouth daily., Disp: 30 tablet, Rfl: 3   bisoprolol (ZEBETA) 5 MG tablet, Take 1 tablet (5 mg total) by mouth daily., Disp: 30 tablet, Rfl: 2   clopidogrel (PLAVIX) 75 MG tablet, Take 1 tablet (75 mg total) by mouth daily., Disp: 30 tablet, Rfl: 3   hydroxypropyl methylcellulose / hypromellose (ISOPTO TEARS / GONIOVISC) 2.5 % ophthalmic solution, 1 drop as needed for dry eyes. (Patient not taking: Reported on 07/01/2022), Disp: , Rfl:    mirtazapine (REMERON) 7.5 MG tablet, Take 7.5 mg by mouth at bedtime., Disp: , Rfl:    Multiple  Vitamins-Minerals (PRESERVISION AREDS 2 PO), Take 1 tablet by mouth 2 (two) times daily., Disp: , Rfl:    Medications ordered in this encounter:  Meds ordered this encounter  Medications   azithromycin (ZITHROMAX) 250 MG tablet    Sig: Take 2 tablets on day 1, then 1 tablet daily on days 2 through 5    Dispense:  6 tablet    Refill:  0   albuterol (VENTOLIN HFA) 108 (90 Base) MCG/ACT inhaler    Sig: Inhale 2 puffs into the lungs every 6 (six) hours as needed for wheezing or shortness of breath.    Dispense:  8 g    Refill:  0   Spacer/Aero-Holding Chambers DEVI    Sig: 1 each by Does not apply route as needed.    Dispense:  1 each    Refill:  0     *If you need refills on other medications prior to your next appointment, please contact your pharmacy*  Follow-Up: Call back or seek an in-person evaluation if the symptoms worsen or if the condition fails to improve as anticipated.  [facility] [phone]  Other Instructions Start mucinex and use albuterol inhaler with spacer. It's ok to give this a little bit of time - no more than 24 hours - to see if that helps improve symptoms. But if it doesn't, start the antibitoics.    If you have been instructed to  have an in-person evaluation today at a local Urgent Care facility, please use the link below. It will take you to a list of all of our available [facility], including address, phone number and hours of operation. Please do not delay care.  [facility]  If you or a family member do not have a primary care provider, use the link below to schedule a visit and establish care. When you choose a [facility] primary care physician or advanced practice provider, you gain a long-term partner in health. Find a Primary Care Provider  Learn more about [facility]'s in-office and virtual care options: [facility] - Get Care Now

## 2023-04-06 NOTE — Progress Notes (Signed)
Virtual Visit Consent   [name] [name], you are scheduled for a virtual visit with a [facility] provider today. Just as with appointments in the office, your consent must be obtained to participate. Your consent will be active for this visit and any virtual visit you may have with one of our providers in the next 365 days. If you have a MyChart account, a copy of this consent can be sent to you electronically.  As this is a virtual visit, video technology does not allow for your provider to perform a traditional examination. This may limit your provider's ability to fully assess your condition. If your provider identifies any concerns that need to be evaluated in person or the need to arrange testing (such as labs, EKG, etc.), we will make arrangements to do so. Although advances in technology are sophisticated, we cannot ensure that it will always work on either your end or our end. If the connection with a video visit is poor, the visit may have to be switched to a telephone visit. With either a video or telephone visit, we are not always able to ensure that we have a secure connection.  By engaging in this virtual visit, you consent to the provision of healthcare and authorize for your insurance to be billed (if applicable) for the services provided during this visit. Depending on your insurance coverage, you may receive a charge related to this service.  I need to obtain your verbal consent now. Are you willing to proceed with your visit today? [name] [name] has provided verbal consent on 04/06/2023 for a virtual visit (video or telephone). Cathlyn Parsons, NP  Date: 04/06/2023 11:12 AM  Virtual Visit via Video Note   I, Cathlyn Parsons, connected with  KATHERENE [name]  ([id number], 1927/11/13) on 04/06/23 at 10:00 AM EST by a video-enabled telemedicine application and verified that I am speaking with the correct person using two identifiers.  Location: Patient: Virtual Visit Location Patient:  Home Provider: Virtual Visit Location Provider: Home Office   I discussed the limitations of evaluation and management by telemedicine and the availability of in person appointments. The patient expressed understanding and agreed to proceed.    History of Present Illness: [name] [name] is a 89 y.o. who identifies as a female who was assigned female at birth, and is being seen today for cough and congestion. Main caregiver Merry Proud and congregational RN Elnita Maxwell are present for visit.   Per Merry Proud, pt had cough that started 03/28/23 but was very mild. By 04/04/23, also had nasal congestion and sneezing, cough is now worse. Coughing up clear mucus sometimes; nasal drainage is sometimes yellow. When RN listens to lungs, has L sided mild expiratory wheeze. Has never had to use an inhaler before. Merry Proud has been treating sx with alka seltzer cold medicine.   Pt herself says is fine but per [facility], she will say she is fine when she is not.   HPI: HPI  Problems:  Patient Active Problem List   Diagnosis Date Noted   Stroke (HCC) 07/01/2022   HTN (hypertension) 07/01/2022   Depression 07/01/2022   Fall at home, initial encounter 07/01/2022   Thyroid nodule 07/01/2022   Benign essential hypertension 04/10/2021   Diverticulosis of small intestine 04/10/2021   Osteoporosis 04/10/2021   Restless legs syndrome (RLS) 04/10/2021   Osteoarthritis 04/10/2021   Gait abnormality 04/10/2021    Allergies: No Known Allergies Medications:  Current Outpatient Medications:    albuterol (VENTOLIN HFA)  108 (90 Base) MCG/ACT inhaler, Inhale 2 puffs into the lungs every 6 (six) hours as needed for wheezing or shortness of breath., Disp: 8 g, Rfl: 0   azithromycin (ZITHROMAX) 250 MG tablet, Take 2 tablets on day 1, then 1 tablet daily on days 2 through 5, Disp: 6 tablet, Rfl: 0   Spacer/Aero-Holding Chambers DEVI, 1 each by Does not apply route as needed., Disp: 1 each, Rfl: 0   Acetaminophen 500 MG capsule, Take 1  tablet by mouth every 4 (four) hours as needed., Disp: , Rfl:    aspirin EC 81 MG tablet, Take 1 tablet (81 mg total) by mouth daily. Swallow whole., Disp: 30 tablet, Rfl: 12   atorvastatin (LIPITOR) 20 MG tablet, Take 1 tablet (20 mg total) by mouth daily., Disp: 30 tablet, Rfl: 3   bisoprolol (ZEBETA) 5 MG tablet, Take 1 tablet (5 mg total) by mouth daily., Disp: 30 tablet, Rfl: 2   clopidogrel (PLAVIX) 75 MG tablet, Take 1 tablet (75 mg total) by mouth daily., Disp: 30 tablet, Rfl: 3   hydroxypropyl methylcellulose / hypromellose (ISOPTO TEARS / GONIOVISC) 2.5 % ophthalmic solution, 1 drop as needed for dry eyes. (Patient not taking: Reported on 07/01/2022), Disp: , Rfl:    mirtazapine (REMERON) 7.5 MG tablet, Take 7.5 mg by mouth at bedtime., Disp: , Rfl:    Multiple Vitamins-Minerals (PRESERVISION AREDS 2 PO), Take 1 tablet by mouth 2 (two) times daily., Disp: , Rfl:   Observations/Objective: Patient is well-developed, well-nourished in no acute distress.  Resting comfortably  at home.  Head is normocephalic, atraumatic.  No labored breathing.  Speech is clear and coherent with logical content.  Patient is alert and oriented at baseline.   RN took VS:  BP 132/79. HR 68. SpO2 97%  Assessment and Plan: 1. Community acquired pneumonia, unspecified laterality (Primary)  Concern for CAP. Pt is not SOB on video and when RN listens to lungs, it does not set off coughing. Discussed options with Brandi. Will rx albuterol with spacer since pt is having some wheezing. STart mucinex. It's ok to give this a little bit of time - no more than 24 hours - to see if that helps relieve sx. If not, start antibitoics.   Follow Up Instructions: I discussed the assessment and treatment plan with the patient. The patient was provided an opportunity to ask questions and all were answered. The patient agreed with the plan and demonstrated an understanding of the instructions.  A copy of instructions were sent to  the patient via MyChart unless otherwise noted below.    The patient was advised to call back or seek an in-person evaluation if the symptoms worsen or if the condition fails to improve as anticipated.    Cathlyn Parsons, NP
